# Patient Record
Sex: Female | Born: 1935 | Race: White | Hispanic: No | Smoking: Never smoker
Health system: Southern US, Community
[De-identification: ages and names within clinical notes are randomized; demographics above are authoritative.]

## PROBLEM LIST (undated history)

## (undated) DIAGNOSIS — E559 Vitamin D deficiency, unspecified: Secondary | ICD-10-CM

## (undated) DIAGNOSIS — M810 Age-related osteoporosis without current pathological fracture: Secondary | ICD-10-CM

## (undated) DIAGNOSIS — E1140 Type 2 diabetes mellitus with diabetic neuropathy, unspecified: Secondary | ICD-10-CM

## (undated) DIAGNOSIS — E785 Hyperlipidemia, unspecified: Secondary | ICD-10-CM

## (undated) DIAGNOSIS — M1990 Unspecified osteoarthritis, unspecified site: Secondary | ICD-10-CM

## (undated) DIAGNOSIS — I482 Chronic atrial fibrillation, unspecified: Secondary | ICD-10-CM

## (undated) DIAGNOSIS — E119 Type 2 diabetes mellitus without complications: Secondary | ICD-10-CM

## (undated) DIAGNOSIS — K8050 Calculus of bile duct without cholangitis or cholecystitis without obstruction: Secondary | ICD-10-CM

## (undated) DIAGNOSIS — M1711 Unilateral primary osteoarthritis, right knee: Secondary | ICD-10-CM

## (undated) DIAGNOSIS — D638 Anemia in other chronic diseases classified elsewhere: Secondary | ICD-10-CM

## (undated) DIAGNOSIS — I2699 Other pulmonary embolism without acute cor pulmonale: Secondary | ICD-10-CM

## (undated) DIAGNOSIS — I509 Heart failure, unspecified: Secondary | ICD-10-CM

## (undated) DIAGNOSIS — N183 Chronic kidney disease, stage 3 (moderate): Secondary | ICD-10-CM

## (undated) DIAGNOSIS — N189 Chronic kidney disease, unspecified: Secondary | ICD-10-CM

## (undated) DIAGNOSIS — R296 Repeated falls: Secondary | ICD-10-CM

## (undated) DIAGNOSIS — H269 Unspecified cataract: Secondary | ICD-10-CM

## (undated) DIAGNOSIS — M109 Gout, unspecified: Secondary | ICD-10-CM

## (undated) DIAGNOSIS — I5032 Chronic diastolic (congestive) heart failure: Secondary | ICD-10-CM

## (undated) DIAGNOSIS — J449 Chronic obstructive pulmonary disease, unspecified: Secondary | ICD-10-CM

## (undated) DIAGNOSIS — I110 Hypertensive heart disease with heart failure: Secondary | ICD-10-CM

## (undated) DIAGNOSIS — D689 Coagulation defect, unspecified: Secondary | ICD-10-CM

## (undated) DIAGNOSIS — E079 Disorder of thyroid, unspecified: Secondary | ICD-10-CM

## (undated) DIAGNOSIS — Z7901 Long term (current) use of anticoagulants: Secondary | ICD-10-CM

## (undated) DIAGNOSIS — I10 Essential (primary) hypertension: Secondary | ICD-10-CM

## (undated) HISTORY — DX: Repeated falls: R29.6

## (undated) HISTORY — PX: EYE SURGERY: SHX253

## (undated) HISTORY — PX: TOTAL KNEE ARTHROPLASTY: SHX125

## (undated) HISTORY — DX: Anemia in other chronic diseases classified elsewhere: D63.8

## (undated) HISTORY — DX: Type 2 diabetes mellitus with diabetic neuropathy, unspecified: E11.40

## (undated) HISTORY — PX: APPENDECTOMY: SHX54

## (undated) HISTORY — DX: Hypertensive heart disease with heart failure: I11.0

## (undated) HISTORY — DX: Disorder of thyroid, unspecified: E07.9

## (undated) HISTORY — DX: Chronic obstructive pulmonary disease, unspecified: J44.9

## (undated) HISTORY — DX: Type 2 diabetes mellitus without complications: E11.9

## (undated) HISTORY — PX: ABDOMINAL HYSTERECTOMY: SHX81

## (undated) HISTORY — DX: Unspecified cataract: H26.9

## (undated) HISTORY — DX: Coagulation defect, unspecified: D68.9

## (undated) HISTORY — DX: Other pulmonary embolism without acute cor pulmonale: I26.99

## (undated) HISTORY — DX: Calculus of bile duct without cholangitis or cholecystitis without obstruction: K80.50

## (undated) HISTORY — DX: Unilateral primary osteoarthritis, right knee: M17.11

## (undated) HISTORY — DX: Heart failure, unspecified: I50.9

## (undated) HISTORY — DX: Chronic diastolic (congestive) heart failure: I50.32

## (undated) HISTORY — DX: Long term (current) use of anticoagulants: Z79.01

## (undated) HISTORY — DX: Unspecified osteoarthritis, unspecified site: M19.90

## (undated) HISTORY — DX: Essential (primary) hypertension: I10

## (undated) HISTORY — DX: Chronic kidney disease, stage 3 (moderate): N18.3

## (undated) HISTORY — PX: ERCP: SHX60

## (undated) HISTORY — DX: Hyperlipidemia, unspecified: E78.5

## (undated) HISTORY — DX: Chronic kidney disease, unspecified: N18.9

## (undated) HISTORY — DX: Chronic atrial fibrillation, unspecified: I48.20

## (undated) HISTORY — PX: CHOLECYSTECTOMY: SHX55

## (undated) HISTORY — DX: Gout, unspecified: M10.9

## (undated) HISTORY — DX: Vitamin D deficiency, unspecified: E55.9

## (undated) HISTORY — DX: Age-related osteoporosis without current pathological fracture: M81.0

---

## 2004-05-06 ENCOUNTER — Encounter: Admission: RE | Admit: 2004-05-06 | Discharge: 2004-05-06 | Payer: Self-pay | Admitting: Rheumatology

## 2014-01-14 ENCOUNTER — Ambulatory Visit (INDEPENDENT_AMBULATORY_CARE_PROVIDER_SITE_OTHER): Payer: Medicare Other

## 2014-01-14 VITALS — BP 119/80 | HR 79 | Resp 18

## 2014-01-14 DIAGNOSIS — Q828 Other specified congenital malformations of skin: Secondary | ICD-10-CM

## 2014-01-14 DIAGNOSIS — M2040 Other hammer toe(s) (acquired), unspecified foot: Secondary | ICD-10-CM

## 2014-01-14 DIAGNOSIS — E1149 Type 2 diabetes mellitus with other diabetic neurological complication: Secondary | ICD-10-CM

## 2014-01-14 DIAGNOSIS — M1990 Unspecified osteoarthritis, unspecified site: Secondary | ICD-10-CM

## 2014-01-14 DIAGNOSIS — E1140 Type 2 diabetes mellitus with diabetic neuropathy, unspecified: Secondary | ICD-10-CM

## 2014-01-14 NOTE — Patient Instructions (Signed)
Diabetes and Foot Care Diabetes may cause you to have problems because of poor blood supply (circulation) to your feet and legs. This may cause the skin on your feet to become thinner, break easier, and heal more slowly. Your skin may become dry, and the skin may peel and crack. You may also have nerve damage in your legs and feet causing decreased feeling in them. You may not notice minor injuries to your feet that could lead to infections or more serious problems. Taking care of your feet is one of the most important things you can do for yourself.  HOME CARE INSTRUCTIONS  Wear shoes at all times, even in the house. Do not go barefoot. Bare feet are easily injured.  Check your feet daily for blisters, cuts, and redness. If you cannot see the bottom of your feet, use a mirror or ask someone for help.  Wash your feet with warm water (do not use hot water) and mild soap. Then pat your feet and the areas between your toes until they are completely dry. Do not soak your feet as this can dry your skin.  Apply a moisturizing lotion or petroleum jelly (that does not contain alcohol and is unscented) to the skin on your feet and to dry, brittle toenails. Do not apply lotion between your toes.  Trim your toenails straight across. Do not dig under them or around the cuticle. File the edges of your nails with an emery board or nail file.  Do not cut corns or calluses or try to remove them with medicine.  Wear clean socks or stockings every day. Make sure they are not too tight. Do not wear knee-high stockings since they may decrease blood flow to your legs.  Wear shoes that fit properly and have enough cushioning. To break in new shoes, wear them for just a few hours a day. This prevents you from injuring your feet. Always look in your shoes before you put them on to be sure there are no objects inside.  Do not cross your legs. This may decrease the blood flow to your feet.  If you find a minor scrape,  cut, or break in the skin on your feet, keep it and the skin around it clean and dry. These areas may be cleansed with mild soap and water. Do not cleanse the area with peroxide, alcohol, or iodine.  When you remove an adhesive bandage, be sure not to damage the skin around it.  If you have a wound, look at it several times a day to make sure it is healing.  Do not use heating pads or hot water bottles. They may burn your skin. If you have lost feeling in your feet or legs, you may not know it is happening until it is too late.  Make sure your health care provider performs a complete foot exam at least annually or more often if you have foot problems. Report any cuts, sores, or bruises to your health care provider immediately. SEEK MEDICAL CARE IF:   You have an injury that is not healing.  You have cuts or breaks in the skin.  You have an ingrown nail.  You notice redness on your legs or feet.  You feel burning or tingling in your legs or feet.  You have pain or cramps in your legs and feet.  Your legs or feet are numb.  Your feet always feel cold. SEEK IMMEDIATE MEDICAL CARE IF:   There is increasing redness,   swelling, or pain in or around a wound.  There is a red line that goes up your leg.  Pus is coming from a wound.  You develop a fever or as directed by your health care provider.  You notice a bad smell coming from an ulcer or wound. Document Released: 09/01/2000 Document Revised: 05/07/2013 Document Reviewed: 02/11/2013 [facility] Patient Information 2014 [place].  Recommend wide square rounded toe box shoe preferably lace up Velcro strap shoe recommend followup for diabetic shoe measurement and fitting.

## 2014-01-14 NOTE — Progress Notes (Signed)
   Subjective:    Patient ID: Felicia Guzman, female    DOB: 12-21-1919, 80 y.o.   MRN: [id number]  HPI my 5th toe on my right foot has been going on for about 4 months and throbs and hurts with shoes and if the sheets touch it and there has been no draining from it    Review of Systems  Endocrine: Positive for cold intolerance.  Hematological: Bruises/bleeds easily.  All other systems reviewed and are negative.      Objective:   Physical Exam 80 year old white female consistent with her daughter well-developed well-nourished. The oriented well x3 vital signs stable. Patient's complaint of a painful fifth toe right foot has been painful tender symptomatically for several months aggravated with certain shoes and activities. 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 There is associated keratoses no secondary infection no discharge drainage no ulceration there is distal to clavus third toe right foot as well which is debrided this time as well as a keratosis of the fifth toe right patient wearing shoes they're slip on a very tapered or tight in the toe box they're Lisa half-inch too narrow on her foot arch and objective findings vascular status is intact pedal pulses palpable with thready DP and PT plus one over 4 bilateral capillary refill time 3 seconds all digits mild to moderate varicosities noted patient also has multiple areas of ecchymosis secondary to Coumadin or warfarin use right foot leg and shin worse than left. No open wounds or sores or identified at this time neurologically epicritic and proprioceptive sensations appear to be diminished on Semmes Weinstein testing to forefoot digits and plantar arch normal plantar response and DTRs noted. Dermatologically skin color pigment normal hair growth absent nails criptotic incurvated her daughter contributing her nails again the  skin does show areas of ecchymosis and pigmentary changes with Coumadin use.       Assessment & Plan:  Assessment this time his diabetes with peripheral neuropathy associated painful hammertoe with corn/porokeratosis fifth toe right foot lesion is debrided to foam padding is applied and multiple pads are dispensed for patient use may recommendations for wider were coming shoes will get authorization for diabetic accident shoes and appropriate followup with shoes and insoles in the future. Patient may be candidate for palliative care in the future and as-needed basis  Harriet Masson DPM

## 2014-02-11 ENCOUNTER — Ambulatory Visit (INDEPENDENT_AMBULATORY_CARE_PROVIDER_SITE_OTHER): Payer: Medicare Other | Admitting: *Deleted

## 2014-02-11 DIAGNOSIS — E1142 Type 2 diabetes mellitus with diabetic polyneuropathy: Secondary | ICD-10-CM

## 2014-02-11 DIAGNOSIS — M2040 Other hammer toe(s) (acquired), unspecified foot: Secondary | ICD-10-CM

## 2014-02-11 DIAGNOSIS — E1149 Type 2 diabetes mellitus with other diabetic neurological complication: Secondary | ICD-10-CM

## 2014-02-11 NOTE — Patient Instructions (Signed)
I am here to get measured for shoes

## 2014-02-11 NOTE — Progress Notes (Signed)
° °  Subjective:    Patient ID: Felicia Guzman, female    DOB: 12/10/1919, 80 y.o.   MRN: [id number]  HPI I am here to get measured for diabetic shoes    Review of Systems     Objective:   Physical Exam        Assessment & Plan:

## 2014-10-09 ENCOUNTER — Ambulatory Visit: Payer: Medicare Other

## 2014-10-23 ENCOUNTER — Ambulatory Visit: Payer: Medicare Other

## 2014-11-02 ENCOUNTER — Ambulatory Visit (INDEPENDENT_AMBULATORY_CARE_PROVIDER_SITE_OTHER): Payer: Medicare Other

## 2014-11-02 VITALS — BP 131/82 | HR 88 | Resp 18

## 2014-11-02 DIAGNOSIS — Q828 Other specified congenital malformations of skin: Secondary | ICD-10-CM

## 2014-11-02 DIAGNOSIS — M2040 Other hammer toe(s) (acquired), unspecified foot: Secondary | ICD-10-CM

## 2014-11-02 DIAGNOSIS — M153 Secondary multiple arthritis: Secondary | ICD-10-CM

## 2014-11-02 DIAGNOSIS — E1140 Type 2 diabetes mellitus with diabetic neuropathy, unspecified: Secondary | ICD-10-CM | POA: Diagnosis not present

## 2014-11-02 NOTE — Progress Notes (Signed)
   Subjective:    Patient ID: Felicia Guzman, female    DOB: [birth year], 81 y.o.   MRN: [id number]  HPI I AM HERE TO GET MY DIABETIC SHOES    Review of Systems no new findings or systemic changes noted     Objective:   Physical Exam Vascular status is unchanged pedal pulses palpable DP plus one PT plus one over 4 bilateral Refill time 3 seconds all digits epicritic and proprioceptive sensations diminished on Semmes Weinstein. Patient does have notable HAV deformity bilateral left right more so than left with multiple keratoses no open wounds no ulcers no saying infections has hammertoe deformities given with keratoses. Pedal pulses are palpable thready one over 4. Patient is also on Coumadin does have a history of applications from her circulation and as well as her diabetes and neuropathy.       Assessment & Plan:  Assessment this time diabetes history peripheral neuropathy and angiopathy digital contractures hammertoe deformities and osteoarthropathy with associated keratoses this time shoes are dispensed with break in wearing instructions a fit and contour well the insoles 1 pair shoes and 3 pairs of multiple density insoles the insoles have full contact to the foot and arch patient is given instructions for use in wearing break-in follow-up in the future as needed for palliative care when needed  Harriet Masson DPM

## 2014-11-02 NOTE — Patient Instructions (Signed)

## 2015-05-03 DIAGNOSIS — Z7901 Long term (current) use of anticoagulants: Secondary | ICD-10-CM

## 2015-05-03 DIAGNOSIS — I5032 Chronic diastolic (congestive) heart failure: Secondary | ICD-10-CM

## 2015-05-03 DIAGNOSIS — I482 Chronic atrial fibrillation, unspecified: Secondary | ICD-10-CM | POA: Insufficient documentation

## 2015-05-03 DIAGNOSIS — I110 Hypertensive heart disease with heart failure: Secondary | ICD-10-CM

## 2015-05-03 HISTORY — DX: Hypertensive heart disease with heart failure: I11.0

## 2015-05-03 HISTORY — DX: Chronic atrial fibrillation, unspecified: I48.20

## 2015-05-03 HISTORY — DX: Chronic diastolic (congestive) heart failure: I50.32

## 2015-05-03 HISTORY — DX: Long term (current) use of anticoagulants: Z79.01

## 2015-09-21 DIAGNOSIS — H348121 Central retinal vein occlusion, left eye, with retinal neovascularization: Secondary | ICD-10-CM | POA: Diagnosis not present

## 2015-10-04 DIAGNOSIS — N2581 Secondary hyperparathyroidism of renal origin: Secondary | ICD-10-CM | POA: Diagnosis not present

## 2015-10-04 DIAGNOSIS — N184 Chronic kidney disease, stage 4 (severe): Secondary | ICD-10-CM | POA: Diagnosis not present

## 2015-10-13 DIAGNOSIS — M13861 Other specified arthritis, right knee: Secondary | ICD-10-CM | POA: Diagnosis not present

## 2015-10-13 DIAGNOSIS — M25461 Effusion, right knee: Secondary | ICD-10-CM | POA: Diagnosis not present

## 2015-10-13 DIAGNOSIS — M1990 Unspecified osteoarthritis, unspecified site: Secondary | ICD-10-CM | POA: Diagnosis not present

## 2015-10-13 DIAGNOSIS — M7989 Other specified soft tissue disorders: Secondary | ICD-10-CM | POA: Diagnosis not present

## 2015-10-13 DIAGNOSIS — M25561 Pain in right knee: Secondary | ICD-10-CM | POA: Diagnosis not present

## 2015-10-19 DIAGNOSIS — N2581 Secondary hyperparathyroidism of renal origin: Secondary | ICD-10-CM | POA: Diagnosis not present

## 2015-10-19 DIAGNOSIS — I10 Essential (primary) hypertension: Secondary | ICD-10-CM | POA: Diagnosis not present

## 2015-10-19 DIAGNOSIS — N184 Chronic kidney disease, stage 4 (severe): Secondary | ICD-10-CM | POA: Diagnosis not present

## 2015-10-19 DIAGNOSIS — D631 Anemia in chronic kidney disease: Secondary | ICD-10-CM | POA: Diagnosis not present

## 2015-10-20 DIAGNOSIS — K8050 Calculus of bile duct without cholangitis or cholecystitis without obstruction: Secondary | ICD-10-CM | POA: Diagnosis not present

## 2015-10-21 DIAGNOSIS — L03116 Cellulitis of left lower limb: Secondary | ICD-10-CM | POA: Diagnosis not present

## 2015-10-22 DIAGNOSIS — S81811A Laceration without foreign body, right lower leg, initial encounter: Secondary | ICD-10-CM | POA: Diagnosis not present

## 2015-10-22 DIAGNOSIS — I10 Essential (primary) hypertension: Secondary | ICD-10-CM | POA: Diagnosis not present

## 2015-10-22 DIAGNOSIS — M109 Gout, unspecified: Secondary | ICD-10-CM | POA: Diagnosis not present

## 2015-10-22 DIAGNOSIS — S81812A Laceration without foreign body, left lower leg, initial encounter: Secondary | ICD-10-CM | POA: Diagnosis not present

## 2015-10-22 DIAGNOSIS — S81801A Unspecified open wound, right lower leg, initial encounter: Secondary | ICD-10-CM | POA: Diagnosis not present

## 2015-10-22 DIAGNOSIS — I509 Heart failure, unspecified: Secondary | ICD-10-CM | POA: Diagnosis not present

## 2015-10-22 DIAGNOSIS — S81802A Unspecified open wound, left lower leg, initial encounter: Secondary | ICD-10-CM | POA: Diagnosis not present

## 2015-10-25 DIAGNOSIS — M1711 Unilateral primary osteoarthritis, right knee: Secondary | ICD-10-CM

## 2015-10-25 HISTORY — DX: Unilateral primary osteoarthritis, right knee: M17.11

## 2015-10-28 DIAGNOSIS — S81801A Unspecified open wound, right lower leg, initial encounter: Secondary | ICD-10-CM | POA: Diagnosis not present

## 2015-10-28 DIAGNOSIS — S81801D Unspecified open wound, right lower leg, subsequent encounter: Secondary | ICD-10-CM | POA: Diagnosis not present

## 2015-10-28 DIAGNOSIS — S81802A Unspecified open wound, left lower leg, initial encounter: Secondary | ICD-10-CM | POA: Diagnosis not present

## 2015-10-28 DIAGNOSIS — S81802D Unspecified open wound, left lower leg, subsequent encounter: Secondary | ICD-10-CM | POA: Diagnosis not present

## 2015-11-08 DIAGNOSIS — S81802A Unspecified open wound, left lower leg, initial encounter: Secondary | ICD-10-CM | POA: Diagnosis not present

## 2015-11-08 DIAGNOSIS — S81801S Unspecified open wound, right lower leg, sequela: Secondary | ICD-10-CM | POA: Diagnosis not present

## 2015-11-08 DIAGNOSIS — R609 Edema, unspecified: Secondary | ICD-10-CM | POA: Diagnosis not present

## 2015-12-09 DIAGNOSIS — H34812 Central retinal vein occlusion, left eye, with macular edema: Secondary | ICD-10-CM | POA: Diagnosis not present

## 2016-01-04 DIAGNOSIS — H34812 Central retinal vein occlusion, left eye, with macular edema: Secondary | ICD-10-CM | POA: Diagnosis not present

## 2016-01-11 DIAGNOSIS — E1122 Type 2 diabetes mellitus with diabetic chronic kidney disease: Secondary | ICD-10-CM | POA: Diagnosis not present

## 2016-01-11 DIAGNOSIS — Z87898 Personal history of other specified conditions: Secondary | ICD-10-CM | POA: Diagnosis not present

## 2016-01-11 DIAGNOSIS — I130 Hypertensive heart and chronic kidney disease with heart failure and stage 1 through stage 4 chronic kidney disease, or unspecified chronic kidney disease: Secondary | ICD-10-CM | POA: Diagnosis not present

## 2016-01-11 DIAGNOSIS — N183 Chronic kidney disease, stage 3 (moderate): Secondary | ICD-10-CM | POA: Diagnosis not present

## 2016-01-11 DIAGNOSIS — D638 Anemia in other chronic diseases classified elsewhere: Secondary | ICD-10-CM | POA: Diagnosis not present

## 2016-01-11 DIAGNOSIS — E039 Hypothyroidism, unspecified: Secondary | ICD-10-CM | POA: Diagnosis not present

## 2016-01-11 DIAGNOSIS — E785 Hyperlipidemia, unspecified: Secondary | ICD-10-CM | POA: Diagnosis not present

## 2016-09-24 DIAGNOSIS — R0600 Dyspnea, unspecified: Secondary | ICD-10-CM | POA: Diagnosis not present

## 2016-09-24 DIAGNOSIS — J22 Unspecified acute lower respiratory infection: Secondary | ICD-10-CM | POA: Diagnosis not present

## 2016-10-03 DIAGNOSIS — N184 Chronic kidney disease, stage 4 (severe): Secondary | ICD-10-CM | POA: Diagnosis not present

## 2016-10-11 DIAGNOSIS — N2581 Secondary hyperparathyroidism of renal origin: Secondary | ICD-10-CM | POA: Diagnosis not present

## 2016-10-11 DIAGNOSIS — I10 Essential (primary) hypertension: Secondary | ICD-10-CM | POA: Diagnosis not present

## 2016-10-11 DIAGNOSIS — N183 Chronic kidney disease, stage 3 (moderate): Secondary | ICD-10-CM | POA: Diagnosis not present

## 2016-10-30 DIAGNOSIS — E039 Hypothyroidism, unspecified: Secondary | ICD-10-CM | POA: Diagnosis not present

## 2016-10-30 DIAGNOSIS — I130 Hypertensive heart and chronic kidney disease with heart failure and stage 1 through stage 4 chronic kidney disease, or unspecified chronic kidney disease: Secondary | ICD-10-CM | POA: Diagnosis not present

## 2016-10-30 DIAGNOSIS — E785 Hyperlipidemia, unspecified: Secondary | ICD-10-CM | POA: Diagnosis not present

## 2016-10-30 DIAGNOSIS — N184 Chronic kidney disease, stage 4 (severe): Secondary | ICD-10-CM | POA: Diagnosis not present

## 2016-10-30 DIAGNOSIS — E1122 Type 2 diabetes mellitus with diabetic chronic kidney disease: Secondary | ICD-10-CM | POA: Diagnosis not present

## 2016-12-13 DIAGNOSIS — E1143 Type 2 diabetes mellitus with diabetic autonomic (poly)neuropathy: Secondary | ICD-10-CM | POA: Diagnosis not present

## 2016-12-13 DIAGNOSIS — S81802A Unspecified open wound, left lower leg, initial encounter: Secondary | ICD-10-CM | POA: Diagnosis not present

## 2016-12-13 DIAGNOSIS — L03116 Cellulitis of left lower limb: Secondary | ICD-10-CM | POA: Diagnosis not present

## 2016-12-13 DIAGNOSIS — S81801A Unspecified open wound, right lower leg, initial encounter: Secondary | ICD-10-CM | POA: Diagnosis not present

## 2016-12-13 DIAGNOSIS — L03115 Cellulitis of right lower limb: Secondary | ICD-10-CM | POA: Diagnosis not present

## 2016-12-14 DIAGNOSIS — L989 Disorder of the skin and subcutaneous tissue, unspecified: Secondary | ICD-10-CM | POA: Diagnosis not present

## 2016-12-14 DIAGNOSIS — N189 Chronic kidney disease, unspecified: Secondary | ICD-10-CM | POA: Diagnosis not present

## 2016-12-14 DIAGNOSIS — I5032 Chronic diastolic (congestive) heart failure: Secondary | ICD-10-CM | POA: Diagnosis not present

## 2016-12-14 DIAGNOSIS — I130 Hypertensive heart and chronic kidney disease with heart failure and stage 1 through stage 4 chronic kidney disease, or unspecified chronic kidney disease: Secondary | ICD-10-CM | POA: Diagnosis not present

## 2016-12-14 DIAGNOSIS — E1143 Type 2 diabetes mellitus with diabetic autonomic (poly)neuropathy: Secondary | ICD-10-CM | POA: Diagnosis not present

## 2016-12-14 DIAGNOSIS — E1122 Type 2 diabetes mellitus with diabetic chronic kidney disease: Secondary | ICD-10-CM | POA: Diagnosis not present

## 2016-12-16 DIAGNOSIS — E1143 Type 2 diabetes mellitus with diabetic autonomic (poly)neuropathy: Secondary | ICD-10-CM | POA: Diagnosis not present

## 2016-12-16 DIAGNOSIS — N189 Chronic kidney disease, unspecified: Secondary | ICD-10-CM | POA: Diagnosis not present

## 2016-12-16 DIAGNOSIS — I130 Hypertensive heart and chronic kidney disease with heart failure and stage 1 through stage 4 chronic kidney disease, or unspecified chronic kidney disease: Secondary | ICD-10-CM | POA: Diagnosis not present

## 2016-12-16 DIAGNOSIS — I5032 Chronic diastolic (congestive) heart failure: Secondary | ICD-10-CM | POA: Diagnosis not present

## 2016-12-16 DIAGNOSIS — L989 Disorder of the skin and subcutaneous tissue, unspecified: Secondary | ICD-10-CM | POA: Diagnosis not present

## 2016-12-16 DIAGNOSIS — E1122 Type 2 diabetes mellitus with diabetic chronic kidney disease: Secondary | ICD-10-CM | POA: Diagnosis not present

## 2016-12-18 DIAGNOSIS — N189 Chronic kidney disease, unspecified: Secondary | ICD-10-CM | POA: Diagnosis not present

## 2016-12-18 DIAGNOSIS — I130 Hypertensive heart and chronic kidney disease with heart failure and stage 1 through stage 4 chronic kidney disease, or unspecified chronic kidney disease: Secondary | ICD-10-CM | POA: Diagnosis not present

## 2016-12-18 DIAGNOSIS — E1143 Type 2 diabetes mellitus with diabetic autonomic (poly)neuropathy: Secondary | ICD-10-CM | POA: Diagnosis not present

## 2016-12-18 DIAGNOSIS — I5032 Chronic diastolic (congestive) heart failure: Secondary | ICD-10-CM | POA: Diagnosis not present

## 2016-12-18 DIAGNOSIS — E1122 Type 2 diabetes mellitus with diabetic chronic kidney disease: Secondary | ICD-10-CM | POA: Diagnosis not present

## 2016-12-18 DIAGNOSIS — L989 Disorder of the skin and subcutaneous tissue, unspecified: Secondary | ICD-10-CM | POA: Diagnosis not present

## 2016-12-20 DIAGNOSIS — N189 Chronic kidney disease, unspecified: Secondary | ICD-10-CM | POA: Diagnosis not present

## 2016-12-20 DIAGNOSIS — E1143 Type 2 diabetes mellitus with diabetic autonomic (poly)neuropathy: Secondary | ICD-10-CM | POA: Diagnosis not present

## 2016-12-20 DIAGNOSIS — L989 Disorder of the skin and subcutaneous tissue, unspecified: Secondary | ICD-10-CM | POA: Diagnosis not present

## 2016-12-20 DIAGNOSIS — I130 Hypertensive heart and chronic kidney disease with heart failure and stage 1 through stage 4 chronic kidney disease, or unspecified chronic kidney disease: Secondary | ICD-10-CM | POA: Diagnosis not present

## 2016-12-20 DIAGNOSIS — E1122 Type 2 diabetes mellitus with diabetic chronic kidney disease: Secondary | ICD-10-CM | POA: Diagnosis not present

## 2016-12-20 DIAGNOSIS — H34812 Central retinal vein occlusion, left eye, with macular edema: Secondary | ICD-10-CM | POA: Diagnosis not present

## 2016-12-20 DIAGNOSIS — I5032 Chronic diastolic (congestive) heart failure: Secondary | ICD-10-CM | POA: Diagnosis not present

## 2016-12-22 DIAGNOSIS — I130 Hypertensive heart and chronic kidney disease with heart failure and stage 1 through stage 4 chronic kidney disease, or unspecified chronic kidney disease: Secondary | ICD-10-CM | POA: Diagnosis not present

## 2016-12-22 DIAGNOSIS — E1122 Type 2 diabetes mellitus with diabetic chronic kidney disease: Secondary | ICD-10-CM | POA: Diagnosis not present

## 2016-12-22 DIAGNOSIS — N189 Chronic kidney disease, unspecified: Secondary | ICD-10-CM | POA: Diagnosis not present

## 2016-12-22 DIAGNOSIS — I5032 Chronic diastolic (congestive) heart failure: Secondary | ICD-10-CM | POA: Diagnosis not present

## 2016-12-22 DIAGNOSIS — L989 Disorder of the skin and subcutaneous tissue, unspecified: Secondary | ICD-10-CM | POA: Diagnosis not present

## 2016-12-22 DIAGNOSIS — E1143 Type 2 diabetes mellitus with diabetic autonomic (poly)neuropathy: Secondary | ICD-10-CM | POA: Diagnosis not present

## 2016-12-26 DIAGNOSIS — E1122 Type 2 diabetes mellitus with diabetic chronic kidney disease: Secondary | ICD-10-CM | POA: Diagnosis not present

## 2016-12-26 DIAGNOSIS — L989 Disorder of the skin and subcutaneous tissue, unspecified: Secondary | ICD-10-CM | POA: Diagnosis not present

## 2016-12-26 DIAGNOSIS — I130 Hypertensive heart and chronic kidney disease with heart failure and stage 1 through stage 4 chronic kidney disease, or unspecified chronic kidney disease: Secondary | ICD-10-CM | POA: Diagnosis not present

## 2016-12-26 DIAGNOSIS — E1143 Type 2 diabetes mellitus with diabetic autonomic (poly)neuropathy: Secondary | ICD-10-CM | POA: Diagnosis not present

## 2016-12-26 DIAGNOSIS — I5032 Chronic diastolic (congestive) heart failure: Secondary | ICD-10-CM | POA: Diagnosis not present

## 2016-12-26 DIAGNOSIS — N189 Chronic kidney disease, unspecified: Secondary | ICD-10-CM | POA: Diagnosis not present

## 2016-12-27 DIAGNOSIS — L03116 Cellulitis of left lower limb: Secondary | ICD-10-CM | POA: Diagnosis not present

## 2016-12-27 DIAGNOSIS — L03115 Cellulitis of right lower limb: Secondary | ICD-10-CM | POA: Diagnosis not present

## 2016-12-29 DIAGNOSIS — E1122 Type 2 diabetes mellitus with diabetic chronic kidney disease: Secondary | ICD-10-CM | POA: Diagnosis not present

## 2016-12-29 DIAGNOSIS — I5032 Chronic diastolic (congestive) heart failure: Secondary | ICD-10-CM | POA: Diagnosis not present

## 2016-12-29 DIAGNOSIS — L989 Disorder of the skin and subcutaneous tissue, unspecified: Secondary | ICD-10-CM | POA: Diagnosis not present

## 2016-12-29 DIAGNOSIS — I130 Hypertensive heart and chronic kidney disease with heart failure and stage 1 through stage 4 chronic kidney disease, or unspecified chronic kidney disease: Secondary | ICD-10-CM | POA: Diagnosis not present

## 2016-12-29 DIAGNOSIS — N189 Chronic kidney disease, unspecified: Secondary | ICD-10-CM | POA: Diagnosis not present

## 2016-12-29 DIAGNOSIS — E1143 Type 2 diabetes mellitus with diabetic autonomic (poly)neuropathy: Secondary | ICD-10-CM | POA: Diagnosis not present

## 2017-01-01 DIAGNOSIS — E1143 Type 2 diabetes mellitus with diabetic autonomic (poly)neuropathy: Secondary | ICD-10-CM | POA: Diagnosis not present

## 2017-01-01 DIAGNOSIS — I5032 Chronic diastolic (congestive) heart failure: Secondary | ICD-10-CM | POA: Diagnosis not present

## 2017-01-01 DIAGNOSIS — I130 Hypertensive heart and chronic kidney disease with heart failure and stage 1 through stage 4 chronic kidney disease, or unspecified chronic kidney disease: Secondary | ICD-10-CM | POA: Diagnosis not present

## 2017-01-01 DIAGNOSIS — L989 Disorder of the skin and subcutaneous tissue, unspecified: Secondary | ICD-10-CM | POA: Diagnosis not present

## 2017-01-01 DIAGNOSIS — E1122 Type 2 diabetes mellitus with diabetic chronic kidney disease: Secondary | ICD-10-CM | POA: Diagnosis not present

## 2017-01-01 DIAGNOSIS — N189 Chronic kidney disease, unspecified: Secondary | ICD-10-CM | POA: Diagnosis not present

## 2017-01-03 DIAGNOSIS — L03116 Cellulitis of left lower limb: Secondary | ICD-10-CM | POA: Diagnosis not present

## 2017-01-03 DIAGNOSIS — L03115 Cellulitis of right lower limb: Secondary | ICD-10-CM | POA: Diagnosis not present

## 2017-01-04 DIAGNOSIS — E1143 Type 2 diabetes mellitus with diabetic autonomic (poly)neuropathy: Secondary | ICD-10-CM | POA: Diagnosis not present

## 2017-01-04 DIAGNOSIS — N189 Chronic kidney disease, unspecified: Secondary | ICD-10-CM | POA: Diagnosis not present

## 2017-01-04 DIAGNOSIS — L989 Disorder of the skin and subcutaneous tissue, unspecified: Secondary | ICD-10-CM | POA: Diagnosis not present

## 2017-01-04 DIAGNOSIS — E1122 Type 2 diabetes mellitus with diabetic chronic kidney disease: Secondary | ICD-10-CM | POA: Diagnosis not present

## 2017-01-04 DIAGNOSIS — I130 Hypertensive heart and chronic kidney disease with heart failure and stage 1 through stage 4 chronic kidney disease, or unspecified chronic kidney disease: Secondary | ICD-10-CM | POA: Diagnosis not present

## 2017-01-04 DIAGNOSIS — I5032 Chronic diastolic (congestive) heart failure: Secondary | ICD-10-CM | POA: Diagnosis not present

## 2017-01-08 DIAGNOSIS — E1122 Type 2 diabetes mellitus with diabetic chronic kidney disease: Secondary | ICD-10-CM | POA: Diagnosis not present

## 2017-01-08 DIAGNOSIS — I130 Hypertensive heart and chronic kidney disease with heart failure and stage 1 through stage 4 chronic kidney disease, or unspecified chronic kidney disease: Secondary | ICD-10-CM | POA: Diagnosis not present

## 2017-01-08 DIAGNOSIS — N189 Chronic kidney disease, unspecified: Secondary | ICD-10-CM | POA: Diagnosis not present

## 2017-01-08 DIAGNOSIS — I5032 Chronic diastolic (congestive) heart failure: Secondary | ICD-10-CM | POA: Diagnosis not present

## 2017-01-08 DIAGNOSIS — E1143 Type 2 diabetes mellitus with diabetic autonomic (poly)neuropathy: Secondary | ICD-10-CM | POA: Diagnosis not present

## 2017-01-08 DIAGNOSIS — L989 Disorder of the skin and subcutaneous tissue, unspecified: Secondary | ICD-10-CM | POA: Diagnosis not present

## 2017-01-16 DIAGNOSIS — I5032 Chronic diastolic (congestive) heart failure: Secondary | ICD-10-CM | POA: Diagnosis not present

## 2017-01-16 DIAGNOSIS — E1122 Type 2 diabetes mellitus with diabetic chronic kidney disease: Secondary | ICD-10-CM | POA: Diagnosis not present

## 2017-01-16 DIAGNOSIS — I130 Hypertensive heart and chronic kidney disease with heart failure and stage 1 through stage 4 chronic kidney disease, or unspecified chronic kidney disease: Secondary | ICD-10-CM | POA: Diagnosis not present

## 2017-01-16 DIAGNOSIS — L989 Disorder of the skin and subcutaneous tissue, unspecified: Secondary | ICD-10-CM | POA: Diagnosis not present

## 2017-01-16 DIAGNOSIS — N189 Chronic kidney disease, unspecified: Secondary | ICD-10-CM | POA: Diagnosis not present

## 2017-01-16 DIAGNOSIS — E1143 Type 2 diabetes mellitus with diabetic autonomic (poly)neuropathy: Secondary | ICD-10-CM | POA: Diagnosis not present

## 2017-01-17 DIAGNOSIS — L03115 Cellulitis of right lower limb: Secondary | ICD-10-CM | POA: Diagnosis not present

## 2017-01-25 DIAGNOSIS — N189 Chronic kidney disease, unspecified: Secondary | ICD-10-CM | POA: Diagnosis not present

## 2017-01-25 DIAGNOSIS — L989 Disorder of the skin and subcutaneous tissue, unspecified: Secondary | ICD-10-CM | POA: Diagnosis not present

## 2017-01-25 DIAGNOSIS — I5032 Chronic diastolic (congestive) heart failure: Secondary | ICD-10-CM | POA: Diagnosis not present

## 2017-01-25 DIAGNOSIS — E1143 Type 2 diabetes mellitus with diabetic autonomic (poly)neuropathy: Secondary | ICD-10-CM | POA: Diagnosis not present

## 2017-01-25 DIAGNOSIS — E1122 Type 2 diabetes mellitus with diabetic chronic kidney disease: Secondary | ICD-10-CM | POA: Diagnosis not present

## 2017-01-25 DIAGNOSIS — I130 Hypertensive heart and chronic kidney disease with heart failure and stage 1 through stage 4 chronic kidney disease, or unspecified chronic kidney disease: Secondary | ICD-10-CM | POA: Diagnosis not present

## 2017-01-29 DIAGNOSIS — E1143 Type 2 diabetes mellitus with diabetic autonomic (poly)neuropathy: Secondary | ICD-10-CM | POA: Diagnosis not present

## 2017-01-29 DIAGNOSIS — L989 Disorder of the skin and subcutaneous tissue, unspecified: Secondary | ICD-10-CM | POA: Diagnosis not present

## 2017-01-29 DIAGNOSIS — E1122 Type 2 diabetes mellitus with diabetic chronic kidney disease: Secondary | ICD-10-CM | POA: Diagnosis not present

## 2017-01-29 DIAGNOSIS — I5032 Chronic diastolic (congestive) heart failure: Secondary | ICD-10-CM | POA: Diagnosis not present

## 2017-01-29 DIAGNOSIS — I130 Hypertensive heart and chronic kidney disease with heart failure and stage 1 through stage 4 chronic kidney disease, or unspecified chronic kidney disease: Secondary | ICD-10-CM | POA: Diagnosis not present

## 2017-01-29 DIAGNOSIS — N189 Chronic kidney disease, unspecified: Secondary | ICD-10-CM | POA: Diagnosis not present

## 2017-01-31 DIAGNOSIS — I482 Chronic atrial fibrillation: Secondary | ICD-10-CM | POA: Diagnosis not present

## 2017-01-31 DIAGNOSIS — I5032 Chronic diastolic (congestive) heart failure: Secondary | ICD-10-CM | POA: Diagnosis not present

## 2017-01-31 DIAGNOSIS — Z7901 Long term (current) use of anticoagulants: Secondary | ICD-10-CM | POA: Diagnosis not present

## 2017-02-05 DIAGNOSIS — I130 Hypertensive heart and chronic kidney disease with heart failure and stage 1 through stage 4 chronic kidney disease, or unspecified chronic kidney disease: Secondary | ICD-10-CM | POA: Diagnosis not present

## 2017-02-05 DIAGNOSIS — E1143 Type 2 diabetes mellitus with diabetic autonomic (poly)neuropathy: Secondary | ICD-10-CM | POA: Diagnosis not present

## 2017-02-05 DIAGNOSIS — N189 Chronic kidney disease, unspecified: Secondary | ICD-10-CM | POA: Diagnosis not present

## 2017-02-05 DIAGNOSIS — I5032 Chronic diastolic (congestive) heart failure: Secondary | ICD-10-CM | POA: Diagnosis not present

## 2017-02-05 DIAGNOSIS — L989 Disorder of the skin and subcutaneous tissue, unspecified: Secondary | ICD-10-CM | POA: Diagnosis not present

## 2017-02-05 DIAGNOSIS — E1122 Type 2 diabetes mellitus with diabetic chronic kidney disease: Secondary | ICD-10-CM | POA: Diagnosis not present

## 2017-02-09 DIAGNOSIS — I130 Hypertensive heart and chronic kidney disease with heart failure and stage 1 through stage 4 chronic kidney disease, or unspecified chronic kidney disease: Secondary | ICD-10-CM | POA: Diagnosis not present

## 2017-02-09 DIAGNOSIS — N189 Chronic kidney disease, unspecified: Secondary | ICD-10-CM | POA: Diagnosis not present

## 2017-02-09 DIAGNOSIS — E1143 Type 2 diabetes mellitus with diabetic autonomic (poly)neuropathy: Secondary | ICD-10-CM | POA: Diagnosis not present

## 2017-02-09 DIAGNOSIS — L989 Disorder of the skin and subcutaneous tissue, unspecified: Secondary | ICD-10-CM | POA: Diagnosis not present

## 2017-02-09 DIAGNOSIS — I5032 Chronic diastolic (congestive) heart failure: Secondary | ICD-10-CM | POA: Diagnosis not present

## 2017-02-09 DIAGNOSIS — E1122 Type 2 diabetes mellitus with diabetic chronic kidney disease: Secondary | ICD-10-CM | POA: Diagnosis not present

## 2017-02-15 DIAGNOSIS — I5032 Chronic diastolic (congestive) heart failure: Secondary | ICD-10-CM | POA: Diagnosis not present

## 2017-02-15 DIAGNOSIS — I130 Hypertensive heart and chronic kidney disease with heart failure and stage 1 through stage 4 chronic kidney disease, or unspecified chronic kidney disease: Secondary | ICD-10-CM | POA: Diagnosis not present

## 2017-02-15 DIAGNOSIS — S51801D Unspecified open wound of right forearm, subsequent encounter: Secondary | ICD-10-CM | POA: Diagnosis not present

## 2017-02-15 DIAGNOSIS — N189 Chronic kidney disease, unspecified: Secondary | ICD-10-CM | POA: Diagnosis not present

## 2017-02-15 DIAGNOSIS — L989 Disorder of the skin and subcutaneous tissue, unspecified: Secondary | ICD-10-CM | POA: Diagnosis not present

## 2017-02-15 DIAGNOSIS — E1122 Type 2 diabetes mellitus with diabetic chronic kidney disease: Secondary | ICD-10-CM | POA: Diagnosis not present

## 2017-02-16 DIAGNOSIS — E1122 Type 2 diabetes mellitus with diabetic chronic kidney disease: Secondary | ICD-10-CM | POA: Diagnosis not present

## 2017-02-16 DIAGNOSIS — I5032 Chronic diastolic (congestive) heart failure: Secondary | ICD-10-CM | POA: Diagnosis not present

## 2017-02-16 DIAGNOSIS — Z789 Other specified health status: Secondary | ICD-10-CM | POA: Diagnosis not present

## 2017-02-16 DIAGNOSIS — L989 Disorder of the skin and subcutaneous tissue, unspecified: Secondary | ICD-10-CM | POA: Diagnosis not present

## 2017-02-16 DIAGNOSIS — S51801D Unspecified open wound of right forearm, subsequent encounter: Secondary | ICD-10-CM | POA: Diagnosis not present

## 2017-02-16 DIAGNOSIS — N189 Chronic kidney disease, unspecified: Secondary | ICD-10-CM | POA: Diagnosis not present

## 2017-02-16 DIAGNOSIS — I130 Hypertensive heart and chronic kidney disease with heart failure and stage 1 through stage 4 chronic kidney disease, or unspecified chronic kidney disease: Secondary | ICD-10-CM | POA: Diagnosis not present

## 2017-02-19 DIAGNOSIS — S51801D Unspecified open wound of right forearm, subsequent encounter: Secondary | ICD-10-CM | POA: Diagnosis not present

## 2017-02-19 DIAGNOSIS — I130 Hypertensive heart and chronic kidney disease with heart failure and stage 1 through stage 4 chronic kidney disease, or unspecified chronic kidney disease: Secondary | ICD-10-CM | POA: Diagnosis not present

## 2017-02-19 DIAGNOSIS — L989 Disorder of the skin and subcutaneous tissue, unspecified: Secondary | ICD-10-CM | POA: Diagnosis not present

## 2017-02-19 DIAGNOSIS — N189 Chronic kidney disease, unspecified: Secondary | ICD-10-CM | POA: Diagnosis not present

## 2017-02-19 DIAGNOSIS — I5032 Chronic diastolic (congestive) heart failure: Secondary | ICD-10-CM | POA: Diagnosis not present

## 2017-02-19 DIAGNOSIS — E1122 Type 2 diabetes mellitus with diabetic chronic kidney disease: Secondary | ICD-10-CM | POA: Diagnosis not present

## 2017-02-22 DIAGNOSIS — I130 Hypertensive heart and chronic kidney disease with heart failure and stage 1 through stage 4 chronic kidney disease, or unspecified chronic kidney disease: Secondary | ICD-10-CM | POA: Diagnosis not present

## 2017-02-22 DIAGNOSIS — E1122 Type 2 diabetes mellitus with diabetic chronic kidney disease: Secondary | ICD-10-CM | POA: Diagnosis not present

## 2017-02-22 DIAGNOSIS — L989 Disorder of the skin and subcutaneous tissue, unspecified: Secondary | ICD-10-CM | POA: Diagnosis not present

## 2017-02-22 DIAGNOSIS — I5032 Chronic diastolic (congestive) heart failure: Secondary | ICD-10-CM | POA: Diagnosis not present

## 2017-02-22 DIAGNOSIS — N189 Chronic kidney disease, unspecified: Secondary | ICD-10-CM | POA: Diagnosis not present

## 2017-02-22 DIAGNOSIS — S51801D Unspecified open wound of right forearm, subsequent encounter: Secondary | ICD-10-CM | POA: Diagnosis not present

## 2017-02-27 DIAGNOSIS — L989 Disorder of the skin and subcutaneous tissue, unspecified: Secondary | ICD-10-CM | POA: Diagnosis not present

## 2017-02-27 DIAGNOSIS — I130 Hypertensive heart and chronic kidney disease with heart failure and stage 1 through stage 4 chronic kidney disease, or unspecified chronic kidney disease: Secondary | ICD-10-CM | POA: Diagnosis not present

## 2017-02-27 DIAGNOSIS — E1122 Type 2 diabetes mellitus with diabetic chronic kidney disease: Secondary | ICD-10-CM | POA: Diagnosis not present

## 2017-02-27 DIAGNOSIS — N189 Chronic kidney disease, unspecified: Secondary | ICD-10-CM | POA: Diagnosis not present

## 2017-02-27 DIAGNOSIS — I5032 Chronic diastolic (congestive) heart failure: Secondary | ICD-10-CM | POA: Diagnosis not present

## 2017-02-27 DIAGNOSIS — S51801D Unspecified open wound of right forearm, subsequent encounter: Secondary | ICD-10-CM | POA: Diagnosis not present

## 2017-03-02 DIAGNOSIS — H35033 Hypertensive retinopathy, bilateral: Secondary | ICD-10-CM | POA: Diagnosis not present

## 2017-03-02 DIAGNOSIS — H01021 Squamous blepharitis right upper eyelid: Secondary | ICD-10-CM | POA: Diagnosis not present

## 2017-03-06 DIAGNOSIS — L03115 Cellulitis of right lower limb: Secondary | ICD-10-CM | POA: Diagnosis not present

## 2017-03-08 DIAGNOSIS — N189 Chronic kidney disease, unspecified: Secondary | ICD-10-CM | POA: Diagnosis not present

## 2017-03-08 DIAGNOSIS — I5032 Chronic diastolic (congestive) heart failure: Secondary | ICD-10-CM | POA: Diagnosis not present

## 2017-03-08 DIAGNOSIS — I130 Hypertensive heart and chronic kidney disease with heart failure and stage 1 through stage 4 chronic kidney disease, or unspecified chronic kidney disease: Secondary | ICD-10-CM | POA: Diagnosis not present

## 2017-03-08 DIAGNOSIS — E1122 Type 2 diabetes mellitus with diabetic chronic kidney disease: Secondary | ICD-10-CM | POA: Diagnosis not present

## 2017-03-08 DIAGNOSIS — L989 Disorder of the skin and subcutaneous tissue, unspecified: Secondary | ICD-10-CM | POA: Diagnosis not present

## 2017-03-08 DIAGNOSIS — S51801D Unspecified open wound of right forearm, subsequent encounter: Secondary | ICD-10-CM | POA: Diagnosis not present

## 2017-03-09 DIAGNOSIS — Z79899 Other long term (current) drug therapy: Secondary | ICD-10-CM | POA: Diagnosis not present

## 2017-03-09 DIAGNOSIS — I4891 Unspecified atrial fibrillation: Secondary | ICD-10-CM | POA: Diagnosis not present

## 2017-03-09 DIAGNOSIS — I130 Hypertensive heart and chronic kidney disease with heart failure and stage 1 through stage 4 chronic kidney disease, or unspecified chronic kidney disease: Secondary | ICD-10-CM | POA: Diagnosis not present

## 2017-03-09 DIAGNOSIS — L03115 Cellulitis of right lower limb: Secondary | ICD-10-CM | POA: Diagnosis not present

## 2017-03-09 DIAGNOSIS — E1122 Type 2 diabetes mellitus with diabetic chronic kidney disease: Secondary | ICD-10-CM | POA: Diagnosis not present

## 2017-03-09 DIAGNOSIS — S81801A Unspecified open wound, right lower leg, initial encounter: Secondary | ICD-10-CM | POA: Diagnosis not present

## 2017-03-09 DIAGNOSIS — N183 Chronic kidney disease, stage 3 (moderate): Secondary | ICD-10-CM | POA: Diagnosis not present

## 2017-03-09 DIAGNOSIS — J449 Chronic obstructive pulmonary disease, unspecified: Secondary | ICD-10-CM | POA: Diagnosis not present

## 2017-03-09 DIAGNOSIS — Z66 Do not resuscitate: Secondary | ICD-10-CM | POA: Diagnosis not present

## 2017-03-09 DIAGNOSIS — Z7982 Long term (current) use of aspirin: Secondary | ICD-10-CM | POA: Diagnosis not present

## 2017-03-09 DIAGNOSIS — I509 Heart failure, unspecified: Secondary | ICD-10-CM | POA: Diagnosis not present

## 2017-03-09 DIAGNOSIS — E039 Hypothyroidism, unspecified: Secondary | ICD-10-CM | POA: Diagnosis not present

## 2017-03-09 DIAGNOSIS — N189 Chronic kidney disease, unspecified: Secondary | ICD-10-CM | POA: Diagnosis not present

## 2017-03-09 DIAGNOSIS — K219 Gastro-esophageal reflux disease without esophagitis: Secondary | ICD-10-CM | POA: Diagnosis not present

## 2017-03-10 DIAGNOSIS — I4891 Unspecified atrial fibrillation: Secondary | ICD-10-CM | POA: Diagnosis not present

## 2017-03-10 DIAGNOSIS — L03115 Cellulitis of right lower limb: Secondary | ICD-10-CM | POA: Diagnosis not present

## 2017-03-10 DIAGNOSIS — N189 Chronic kidney disease, unspecified: Secondary | ICD-10-CM | POA: Diagnosis not present

## 2017-03-10 DIAGNOSIS — J449 Chronic obstructive pulmonary disease, unspecified: Secondary | ICD-10-CM | POA: Diagnosis not present

## 2017-03-13 DIAGNOSIS — Z9189 Other specified personal risk factors, not elsewhere classified: Secondary | ICD-10-CM | POA: Diagnosis not present

## 2017-03-13 DIAGNOSIS — L03115 Cellulitis of right lower limb: Secondary | ICD-10-CM | POA: Diagnosis not present

## 2017-03-13 DIAGNOSIS — I130 Hypertensive heart and chronic kidney disease with heart failure and stage 1 through stage 4 chronic kidney disease, or unspecified chronic kidney disease: Secondary | ICD-10-CM | POA: Diagnosis not present

## 2017-03-13 DIAGNOSIS — Z09 Encounter for follow-up examination after completed treatment for conditions other than malignant neoplasm: Secondary | ICD-10-CM | POA: Diagnosis not present

## 2017-03-13 DIAGNOSIS — I482 Chronic atrial fibrillation: Secondary | ICD-10-CM | POA: Diagnosis not present

## 2017-03-14 DIAGNOSIS — I5032 Chronic diastolic (congestive) heart failure: Secondary | ICD-10-CM | POA: Diagnosis not present

## 2017-03-14 DIAGNOSIS — S51801D Unspecified open wound of right forearm, subsequent encounter: Secondary | ICD-10-CM | POA: Diagnosis not present

## 2017-03-14 DIAGNOSIS — I130 Hypertensive heart and chronic kidney disease with heart failure and stage 1 through stage 4 chronic kidney disease, or unspecified chronic kidney disease: Secondary | ICD-10-CM | POA: Diagnosis not present

## 2017-03-14 DIAGNOSIS — N189 Chronic kidney disease, unspecified: Secondary | ICD-10-CM | POA: Diagnosis not present

## 2017-03-14 DIAGNOSIS — L989 Disorder of the skin and subcutaneous tissue, unspecified: Secondary | ICD-10-CM | POA: Diagnosis not present

## 2017-03-14 DIAGNOSIS — E1122 Type 2 diabetes mellitus with diabetic chronic kidney disease: Secondary | ICD-10-CM | POA: Diagnosis not present

## 2017-03-20 DIAGNOSIS — S51801D Unspecified open wound of right forearm, subsequent encounter: Secondary | ICD-10-CM | POA: Diagnosis not present

## 2017-03-20 DIAGNOSIS — I130 Hypertensive heart and chronic kidney disease with heart failure and stage 1 through stage 4 chronic kidney disease, or unspecified chronic kidney disease: Secondary | ICD-10-CM | POA: Diagnosis not present

## 2017-03-20 DIAGNOSIS — L989 Disorder of the skin and subcutaneous tissue, unspecified: Secondary | ICD-10-CM | POA: Diagnosis not present

## 2017-03-20 DIAGNOSIS — E1122 Type 2 diabetes mellitus with diabetic chronic kidney disease: Secondary | ICD-10-CM | POA: Diagnosis not present

## 2017-03-20 DIAGNOSIS — L03115 Cellulitis of right lower limb: Secondary | ICD-10-CM | POA: Diagnosis not present

## 2017-03-20 DIAGNOSIS — M79604 Pain in right leg: Secondary | ICD-10-CM | POA: Diagnosis not present

## 2017-03-20 DIAGNOSIS — N189 Chronic kidney disease, unspecified: Secondary | ICD-10-CM | POA: Diagnosis not present

## 2017-03-20 DIAGNOSIS — I5032 Chronic diastolic (congestive) heart failure: Secondary | ICD-10-CM | POA: Diagnosis not present

## 2017-03-27 DIAGNOSIS — E1143 Type 2 diabetes mellitus with diabetic autonomic (poly)neuropathy: Secondary | ICD-10-CM | POA: Diagnosis not present

## 2017-03-27 DIAGNOSIS — S51801D Unspecified open wound of right forearm, subsequent encounter: Secondary | ICD-10-CM | POA: Diagnosis not present

## 2017-03-27 DIAGNOSIS — M79604 Pain in right leg: Secondary | ICD-10-CM | POA: Diagnosis not present

## 2017-03-27 DIAGNOSIS — I130 Hypertensive heart and chronic kidney disease with heart failure and stage 1 through stage 4 chronic kidney disease, or unspecified chronic kidney disease: Secondary | ICD-10-CM | POA: Diagnosis not present

## 2017-03-27 DIAGNOSIS — E1122 Type 2 diabetes mellitus with diabetic chronic kidney disease: Secondary | ICD-10-CM | POA: Diagnosis not present

## 2017-03-27 DIAGNOSIS — N189 Chronic kidney disease, unspecified: Secondary | ICD-10-CM | POA: Diagnosis not present

## 2017-03-27 DIAGNOSIS — I5032 Chronic diastolic (congestive) heart failure: Secondary | ICD-10-CM | POA: Diagnosis not present

## 2017-03-27 DIAGNOSIS — N183 Chronic kidney disease, stage 3 (moderate): Secondary | ICD-10-CM | POA: Diagnosis not present

## 2017-03-27 DIAGNOSIS — L989 Disorder of the skin and subcutaneous tissue, unspecified: Secondary | ICD-10-CM | POA: Diagnosis not present

## 2017-03-27 DIAGNOSIS — L03115 Cellulitis of right lower limb: Secondary | ICD-10-CM | POA: Diagnosis not present

## 2017-03-28 DIAGNOSIS — H01021 Squamous blepharitis right upper eyelid: Secondary | ICD-10-CM | POA: Diagnosis not present

## 2017-03-28 DIAGNOSIS — H34812 Central retinal vein occlusion, left eye, with macular edema: Secondary | ICD-10-CM | POA: Diagnosis not present

## 2017-03-28 DIAGNOSIS — H35033 Hypertensive retinopathy, bilateral: Secondary | ICD-10-CM | POA: Diagnosis not present

## 2017-04-03 DIAGNOSIS — N189 Chronic kidney disease, unspecified: Secondary | ICD-10-CM | POA: Diagnosis not present

## 2017-04-03 DIAGNOSIS — I5032 Chronic diastolic (congestive) heart failure: Secondary | ICD-10-CM | POA: Diagnosis not present

## 2017-04-03 DIAGNOSIS — S51801D Unspecified open wound of right forearm, subsequent encounter: Secondary | ICD-10-CM | POA: Diagnosis not present

## 2017-04-03 DIAGNOSIS — L989 Disorder of the skin and subcutaneous tissue, unspecified: Secondary | ICD-10-CM | POA: Diagnosis not present

## 2017-04-03 DIAGNOSIS — E1122 Type 2 diabetes mellitus with diabetic chronic kidney disease: Secondary | ICD-10-CM | POA: Diagnosis not present

## 2017-04-03 DIAGNOSIS — I130 Hypertensive heart and chronic kidney disease with heart failure and stage 1 through stage 4 chronic kidney disease, or unspecified chronic kidney disease: Secondary | ICD-10-CM | POA: Diagnosis not present

## 2017-04-04 DIAGNOSIS — L03115 Cellulitis of right lower limb: Secondary | ICD-10-CM | POA: Diagnosis not present

## 2017-04-04 DIAGNOSIS — M79604 Pain in right leg: Secondary | ICD-10-CM | POA: Diagnosis not present

## 2017-04-05 DIAGNOSIS — R6520 Severe sepsis without septic shock: Secondary | ICD-10-CM | POA: Diagnosis not present

## 2017-04-05 DIAGNOSIS — A419 Sepsis, unspecified organism: Secondary | ICD-10-CM | POA: Diagnosis not present

## 2017-04-05 DIAGNOSIS — L03115 Cellulitis of right lower limb: Secondary | ICD-10-CM | POA: Diagnosis not present

## 2017-04-06 DIAGNOSIS — I872 Venous insufficiency (chronic) (peripheral): Secondary | ICD-10-CM | POA: Diagnosis not present

## 2017-04-06 DIAGNOSIS — J449 Chronic obstructive pulmonary disease, unspecified: Secondary | ICD-10-CM | POA: Diagnosis not present

## 2017-04-06 DIAGNOSIS — L03115 Cellulitis of right lower limb: Secondary | ICD-10-CM | POA: Diagnosis not present

## 2017-04-06 DIAGNOSIS — E119 Type 2 diabetes mellitus without complications: Secondary | ICD-10-CM | POA: Diagnosis not present

## 2017-04-06 DIAGNOSIS — N289 Disorder of kidney and ureter, unspecified: Secondary | ICD-10-CM | POA: Diagnosis not present

## 2017-04-06 DIAGNOSIS — E039 Hypothyroidism, unspecified: Secondary | ICD-10-CM | POA: Diagnosis not present

## 2017-04-06 DIAGNOSIS — E113292 Type 2 diabetes mellitus with mild nonproliferative diabetic retinopathy without macular edema, left eye: Secondary | ICD-10-CM | POA: Diagnosis not present

## 2017-04-06 DIAGNOSIS — L97909 Non-pressure chronic ulcer of unspecified part of unspecified lower leg with unspecified severity: Secondary | ICD-10-CM | POA: Diagnosis not present

## 2017-04-06 DIAGNOSIS — A419 Sepsis, unspecified organism: Secondary | ICD-10-CM | POA: Diagnosis not present

## 2017-04-06 DIAGNOSIS — I509 Heart failure, unspecified: Secondary | ICD-10-CM | POA: Diagnosis not present

## 2017-04-06 DIAGNOSIS — A4159 Other Gram-negative sepsis: Secondary | ICD-10-CM | POA: Diagnosis not present

## 2017-04-06 DIAGNOSIS — I739 Peripheral vascular disease, unspecified: Secondary | ICD-10-CM | POA: Diagnosis not present

## 2017-04-06 DIAGNOSIS — E876 Hypokalemia: Secondary | ICD-10-CM | POA: Diagnosis not present

## 2017-04-06 DIAGNOSIS — R7881 Bacteremia: Secondary | ICD-10-CM | POA: Diagnosis not present

## 2017-04-06 DIAGNOSIS — B9689 Other specified bacterial agents as the cause of diseases classified elsewhere: Secondary | ICD-10-CM | POA: Diagnosis not present

## 2017-04-06 DIAGNOSIS — R6520 Severe sepsis without septic shock: Secondary | ICD-10-CM | POA: Diagnosis not present

## 2017-04-06 DIAGNOSIS — I110 Hypertensive heart disease with heart failure: Secondary | ICD-10-CM | POA: Diagnosis not present

## 2017-04-06 DIAGNOSIS — Z8679 Personal history of other diseases of the circulatory system: Secondary | ICD-10-CM | POA: Diagnosis not present

## 2017-04-06 DIAGNOSIS — E7800 Pure hypercholesterolemia, unspecified: Secondary | ICD-10-CM | POA: Diagnosis not present

## 2017-04-06 DIAGNOSIS — K219 Gastro-esophageal reflux disease without esophagitis: Secondary | ICD-10-CM | POA: Diagnosis not present

## 2017-04-06 DIAGNOSIS — Z79899 Other long term (current) drug therapy: Secondary | ICD-10-CM | POA: Diagnosis not present

## 2017-04-06 DIAGNOSIS — Z7982 Long term (current) use of aspirin: Secondary | ICD-10-CM | POA: Diagnosis not present

## 2017-04-06 DIAGNOSIS — J90 Pleural effusion, not elsewhere classified: Secondary | ICD-10-CM | POA: Diagnosis not present

## 2017-04-06 DIAGNOSIS — I83019 Varicose veins of right lower extremity with ulcer of unspecified site: Secondary | ICD-10-CM | POA: Diagnosis not present

## 2017-04-07 DIAGNOSIS — L03115 Cellulitis of right lower limb: Secondary | ICD-10-CM | POA: Diagnosis not present

## 2017-04-07 DIAGNOSIS — A419 Sepsis, unspecified organism: Secondary | ICD-10-CM | POA: Diagnosis not present

## 2017-04-11 DIAGNOSIS — L989 Disorder of the skin and subcutaneous tissue, unspecified: Secondary | ICD-10-CM | POA: Diagnosis not present

## 2017-04-11 DIAGNOSIS — S51801D Unspecified open wound of right forearm, subsequent encounter: Secondary | ICD-10-CM | POA: Diagnosis not present

## 2017-04-11 DIAGNOSIS — I130 Hypertensive heart and chronic kidney disease with heart failure and stage 1 through stage 4 chronic kidney disease, or unspecified chronic kidney disease: Secondary | ICD-10-CM | POA: Diagnosis not present

## 2017-04-11 DIAGNOSIS — E1122 Type 2 diabetes mellitus with diabetic chronic kidney disease: Secondary | ICD-10-CM | POA: Diagnosis not present

## 2017-04-11 DIAGNOSIS — I5032 Chronic diastolic (congestive) heart failure: Secondary | ICD-10-CM | POA: Diagnosis not present

## 2017-04-11 DIAGNOSIS — N189 Chronic kidney disease, unspecified: Secondary | ICD-10-CM | POA: Diagnosis not present

## 2017-04-12 DIAGNOSIS — I5032 Chronic diastolic (congestive) heart failure: Secondary | ICD-10-CM | POA: Diagnosis not present

## 2017-04-12 DIAGNOSIS — S51801D Unspecified open wound of right forearm, subsequent encounter: Secondary | ICD-10-CM | POA: Diagnosis not present

## 2017-04-12 DIAGNOSIS — L989 Disorder of the skin and subcutaneous tissue, unspecified: Secondary | ICD-10-CM | POA: Diagnosis not present

## 2017-04-12 DIAGNOSIS — N189 Chronic kidney disease, unspecified: Secondary | ICD-10-CM | POA: Diagnosis not present

## 2017-04-12 DIAGNOSIS — E1122 Type 2 diabetes mellitus with diabetic chronic kidney disease: Secondary | ICD-10-CM | POA: Diagnosis not present

## 2017-04-12 DIAGNOSIS — I130 Hypertensive heart and chronic kidney disease with heart failure and stage 1 through stage 4 chronic kidney disease, or unspecified chronic kidney disease: Secondary | ICD-10-CM | POA: Diagnosis not present

## 2017-04-17 DIAGNOSIS — E1122 Type 2 diabetes mellitus with diabetic chronic kidney disease: Secondary | ICD-10-CM | POA: Diagnosis not present

## 2017-04-17 DIAGNOSIS — I130 Hypertensive heart and chronic kidney disease with heart failure and stage 1 through stage 4 chronic kidney disease, or unspecified chronic kidney disease: Secondary | ICD-10-CM | POA: Diagnosis not present

## 2017-04-17 DIAGNOSIS — I872 Venous insufficiency (chronic) (peripheral): Secondary | ICD-10-CM | POA: Diagnosis not present

## 2017-04-17 DIAGNOSIS — L03116 Cellulitis of left lower limb: Secondary | ICD-10-CM | POA: Diagnosis not present

## 2017-04-17 DIAGNOSIS — I5032 Chronic diastolic (congestive) heart failure: Secondary | ICD-10-CM | POA: Diagnosis not present

## 2017-04-17 DIAGNOSIS — L97812 Non-pressure chronic ulcer of other part of right lower leg with fat layer exposed: Secondary | ICD-10-CM | POA: Diagnosis not present

## 2017-04-18 DIAGNOSIS — M79604 Pain in right leg: Secondary | ICD-10-CM | POA: Diagnosis not present

## 2017-04-18 DIAGNOSIS — D72829 Elevated white blood cell count, unspecified: Secondary | ICD-10-CM | POA: Diagnosis not present

## 2017-04-18 DIAGNOSIS — Z9189 Other specified personal risk factors, not elsewhere classified: Secondary | ICD-10-CM | POA: Diagnosis not present

## 2017-04-18 DIAGNOSIS — L03115 Cellulitis of right lower limb: Secondary | ICD-10-CM | POA: Diagnosis not present

## 2017-04-18 DIAGNOSIS — L03116 Cellulitis of left lower limb: Secondary | ICD-10-CM | POA: Diagnosis not present

## 2017-04-19 DIAGNOSIS — E1122 Type 2 diabetes mellitus with diabetic chronic kidney disease: Secondary | ICD-10-CM | POA: Diagnosis not present

## 2017-04-19 DIAGNOSIS — I130 Hypertensive heart and chronic kidney disease with heart failure and stage 1 through stage 4 chronic kidney disease, or unspecified chronic kidney disease: Secondary | ICD-10-CM | POA: Diagnosis not present

## 2017-04-19 DIAGNOSIS — I872 Venous insufficiency (chronic) (peripheral): Secondary | ICD-10-CM | POA: Diagnosis not present

## 2017-04-19 DIAGNOSIS — L03116 Cellulitis of left lower limb: Secondary | ICD-10-CM | POA: Diagnosis not present

## 2017-04-19 DIAGNOSIS — I5032 Chronic diastolic (congestive) heart failure: Secondary | ICD-10-CM | POA: Diagnosis not present

## 2017-04-19 DIAGNOSIS — L97812 Non-pressure chronic ulcer of other part of right lower leg with fat layer exposed: Secondary | ICD-10-CM | POA: Diagnosis not present

## 2017-04-23 DIAGNOSIS — L97812 Non-pressure chronic ulcer of other part of right lower leg with fat layer exposed: Secondary | ICD-10-CM | POA: Diagnosis not present

## 2017-04-23 DIAGNOSIS — I5032 Chronic diastolic (congestive) heart failure: Secondary | ICD-10-CM | POA: Diagnosis not present

## 2017-04-23 DIAGNOSIS — E1122 Type 2 diabetes mellitus with diabetic chronic kidney disease: Secondary | ICD-10-CM | POA: Diagnosis not present

## 2017-04-23 DIAGNOSIS — I130 Hypertensive heart and chronic kidney disease with heart failure and stage 1 through stage 4 chronic kidney disease, or unspecified chronic kidney disease: Secondary | ICD-10-CM | POA: Diagnosis not present

## 2017-04-23 DIAGNOSIS — I872 Venous insufficiency (chronic) (peripheral): Secondary | ICD-10-CM | POA: Diagnosis not present

## 2017-04-23 DIAGNOSIS — L03116 Cellulitis of left lower limb: Secondary | ICD-10-CM | POA: Diagnosis not present

## 2017-04-25 DIAGNOSIS — K921 Melena: Secondary | ICD-10-CM | POA: Diagnosis not present

## 2017-04-25 DIAGNOSIS — L03115 Cellulitis of right lower limb: Secondary | ICD-10-CM | POA: Diagnosis not present

## 2017-04-25 DIAGNOSIS — M79604 Pain in right leg: Secondary | ICD-10-CM | POA: Diagnosis not present

## 2017-04-25 DIAGNOSIS — L03116 Cellulitis of left lower limb: Secondary | ICD-10-CM | POA: Diagnosis not present

## 2017-04-26 DIAGNOSIS — L03116 Cellulitis of left lower limb: Secondary | ICD-10-CM | POA: Diagnosis not present

## 2017-04-26 DIAGNOSIS — I872 Venous insufficiency (chronic) (peripheral): Secondary | ICD-10-CM | POA: Diagnosis not present

## 2017-04-26 DIAGNOSIS — I5032 Chronic diastolic (congestive) heart failure: Secondary | ICD-10-CM | POA: Diagnosis not present

## 2017-04-26 DIAGNOSIS — I130 Hypertensive heart and chronic kidney disease with heart failure and stage 1 through stage 4 chronic kidney disease, or unspecified chronic kidney disease: Secondary | ICD-10-CM | POA: Diagnosis not present

## 2017-04-26 DIAGNOSIS — E1122 Type 2 diabetes mellitus with diabetic chronic kidney disease: Secondary | ICD-10-CM | POA: Diagnosis not present

## 2017-04-26 DIAGNOSIS — L97812 Non-pressure chronic ulcer of other part of right lower leg with fat layer exposed: Secondary | ICD-10-CM | POA: Diagnosis not present

## 2017-05-01 DIAGNOSIS — M79604 Pain in right leg: Secondary | ICD-10-CM | POA: Diagnosis not present

## 2017-05-01 DIAGNOSIS — L03115 Cellulitis of right lower limb: Secondary | ICD-10-CM | POA: Diagnosis not present

## 2017-05-01 DIAGNOSIS — I130 Hypertensive heart and chronic kidney disease with heart failure and stage 1 through stage 4 chronic kidney disease, or unspecified chronic kidney disease: Secondary | ICD-10-CM | POA: Diagnosis not present

## 2017-05-01 DIAGNOSIS — L97812 Non-pressure chronic ulcer of other part of right lower leg with fat layer exposed: Secondary | ICD-10-CM | POA: Diagnosis not present

## 2017-05-01 DIAGNOSIS — I5032 Chronic diastolic (congestive) heart failure: Secondary | ICD-10-CM | POA: Diagnosis not present

## 2017-05-01 DIAGNOSIS — I872 Venous insufficiency (chronic) (peripheral): Secondary | ICD-10-CM | POA: Diagnosis not present

## 2017-05-01 DIAGNOSIS — L03116 Cellulitis of left lower limb: Secondary | ICD-10-CM | POA: Diagnosis not present

## 2017-05-01 DIAGNOSIS — E1122 Type 2 diabetes mellitus with diabetic chronic kidney disease: Secondary | ICD-10-CM | POA: Diagnosis not present

## 2017-05-03 DIAGNOSIS — I5032 Chronic diastolic (congestive) heart failure: Secondary | ICD-10-CM | POA: Diagnosis not present

## 2017-05-03 DIAGNOSIS — L03116 Cellulitis of left lower limb: Secondary | ICD-10-CM | POA: Diagnosis not present

## 2017-05-03 DIAGNOSIS — I130 Hypertensive heart and chronic kidney disease with heart failure and stage 1 through stage 4 chronic kidney disease, or unspecified chronic kidney disease: Secondary | ICD-10-CM | POA: Diagnosis not present

## 2017-05-03 DIAGNOSIS — L97812 Non-pressure chronic ulcer of other part of right lower leg with fat layer exposed: Secondary | ICD-10-CM | POA: Diagnosis not present

## 2017-05-03 DIAGNOSIS — I872 Venous insufficiency (chronic) (peripheral): Secondary | ICD-10-CM | POA: Diagnosis not present

## 2017-05-03 DIAGNOSIS — E1122 Type 2 diabetes mellitus with diabetic chronic kidney disease: Secondary | ICD-10-CM | POA: Diagnosis not present

## 2017-05-08 DIAGNOSIS — L97812 Non-pressure chronic ulcer of other part of right lower leg with fat layer exposed: Secondary | ICD-10-CM | POA: Diagnosis not present

## 2017-05-08 DIAGNOSIS — I130 Hypertensive heart and chronic kidney disease with heart failure and stage 1 through stage 4 chronic kidney disease, or unspecified chronic kidney disease: Secondary | ICD-10-CM | POA: Diagnosis not present

## 2017-05-08 DIAGNOSIS — L03116 Cellulitis of left lower limb: Secondary | ICD-10-CM | POA: Diagnosis not present

## 2017-05-08 DIAGNOSIS — I872 Venous insufficiency (chronic) (peripheral): Secondary | ICD-10-CM | POA: Diagnosis not present

## 2017-05-08 DIAGNOSIS — E1122 Type 2 diabetes mellitus with diabetic chronic kidney disease: Secondary | ICD-10-CM | POA: Diagnosis not present

## 2017-05-08 DIAGNOSIS — I5032 Chronic diastolic (congestive) heart failure: Secondary | ICD-10-CM | POA: Diagnosis not present

## 2017-05-09 DIAGNOSIS — M79604 Pain in right leg: Secondary | ICD-10-CM | POA: Diagnosis not present

## 2017-05-09 DIAGNOSIS — L03115 Cellulitis of right lower limb: Secondary | ICD-10-CM | POA: Diagnosis not present

## 2017-05-09 DIAGNOSIS — L97219 Non-pressure chronic ulcer of right calf with unspecified severity: Secondary | ICD-10-CM | POA: Diagnosis not present

## 2017-05-09 DIAGNOSIS — L03116 Cellulitis of left lower limb: Secondary | ICD-10-CM | POA: Diagnosis not present

## 2017-05-09 DIAGNOSIS — I83012 Varicose veins of right lower extremity with ulcer of calf: Secondary | ICD-10-CM | POA: Diagnosis not present

## 2017-05-10 DIAGNOSIS — L97812 Non-pressure chronic ulcer of other part of right lower leg with fat layer exposed: Secondary | ICD-10-CM | POA: Diagnosis not present

## 2017-05-10 DIAGNOSIS — I130 Hypertensive heart and chronic kidney disease with heart failure and stage 1 through stage 4 chronic kidney disease, or unspecified chronic kidney disease: Secondary | ICD-10-CM | POA: Diagnosis not present

## 2017-05-10 DIAGNOSIS — I872 Venous insufficiency (chronic) (peripheral): Secondary | ICD-10-CM | POA: Diagnosis not present

## 2017-05-10 DIAGNOSIS — L03116 Cellulitis of left lower limb: Secondary | ICD-10-CM | POA: Diagnosis not present

## 2017-05-10 DIAGNOSIS — I5032 Chronic diastolic (congestive) heart failure: Secondary | ICD-10-CM | POA: Diagnosis not present

## 2017-05-10 DIAGNOSIS — E1122 Type 2 diabetes mellitus with diabetic chronic kidney disease: Secondary | ICD-10-CM | POA: Diagnosis not present

## 2017-05-11 DIAGNOSIS — L97812 Non-pressure chronic ulcer of other part of right lower leg with fat layer exposed: Secondary | ICD-10-CM | POA: Diagnosis not present

## 2017-05-11 DIAGNOSIS — I872 Venous insufficiency (chronic) (peripheral): Secondary | ICD-10-CM | POA: Diagnosis not present

## 2017-05-11 DIAGNOSIS — I5032 Chronic diastolic (congestive) heart failure: Secondary | ICD-10-CM | POA: Diagnosis not present

## 2017-05-11 DIAGNOSIS — I130 Hypertensive heart and chronic kidney disease with heart failure and stage 1 through stage 4 chronic kidney disease, or unspecified chronic kidney disease: Secondary | ICD-10-CM | POA: Diagnosis not present

## 2017-05-11 DIAGNOSIS — E1122 Type 2 diabetes mellitus with diabetic chronic kidney disease: Secondary | ICD-10-CM | POA: Diagnosis not present

## 2017-05-11 DIAGNOSIS — L03116 Cellulitis of left lower limb: Secondary | ICD-10-CM | POA: Diagnosis not present

## 2017-05-15 DIAGNOSIS — E559 Vitamin D deficiency, unspecified: Secondary | ICD-10-CM | POA: Diagnosis not present

## 2017-05-15 DIAGNOSIS — Z1212 Encounter for screening for malignant neoplasm of rectum: Secondary | ICD-10-CM | POA: Diagnosis not present

## 2017-05-15 DIAGNOSIS — I130 Hypertensive heart and chronic kidney disease with heart failure and stage 1 through stage 4 chronic kidney disease, or unspecified chronic kidney disease: Secondary | ICD-10-CM | POA: Diagnosis not present

## 2017-05-15 DIAGNOSIS — Z0000 Encounter for general adult medical examination without abnormal findings: Secondary | ICD-10-CM | POA: Diagnosis not present

## 2017-05-15 DIAGNOSIS — E785 Hyperlipidemia, unspecified: Secondary | ICD-10-CM | POA: Diagnosis not present

## 2017-05-15 DIAGNOSIS — E1122 Type 2 diabetes mellitus with diabetic chronic kidney disease: Secondary | ICD-10-CM | POA: Diagnosis not present

## 2017-05-15 DIAGNOSIS — N183 Chronic kidney disease, stage 3 (moderate): Secondary | ICD-10-CM | POA: Diagnosis not present

## 2017-05-15 DIAGNOSIS — E039 Hypothyroidism, unspecified: Secondary | ICD-10-CM | POA: Diagnosis not present

## 2017-05-16 DIAGNOSIS — Z789 Other specified health status: Secondary | ICD-10-CM | POA: Diagnosis not present

## 2017-05-16 DIAGNOSIS — L03116 Cellulitis of left lower limb: Secondary | ICD-10-CM | POA: Diagnosis not present

## 2017-05-16 DIAGNOSIS — L97812 Non-pressure chronic ulcer of other part of right lower leg with fat layer exposed: Secondary | ICD-10-CM | POA: Diagnosis not present

## 2017-05-17 DIAGNOSIS — E1122 Type 2 diabetes mellitus with diabetic chronic kidney disease: Secondary | ICD-10-CM | POA: Diagnosis not present

## 2017-05-17 DIAGNOSIS — L97812 Non-pressure chronic ulcer of other part of right lower leg with fat layer exposed: Secondary | ICD-10-CM | POA: Diagnosis not present

## 2017-05-17 DIAGNOSIS — I130 Hypertensive heart and chronic kidney disease with heart failure and stage 1 through stage 4 chronic kidney disease, or unspecified chronic kidney disease: Secondary | ICD-10-CM | POA: Diagnosis not present

## 2017-05-17 DIAGNOSIS — I5032 Chronic diastolic (congestive) heart failure: Secondary | ICD-10-CM | POA: Diagnosis not present

## 2017-05-17 DIAGNOSIS — I872 Venous insufficiency (chronic) (peripheral): Secondary | ICD-10-CM | POA: Diagnosis not present

## 2017-05-17 DIAGNOSIS — L03116 Cellulitis of left lower limb: Secondary | ICD-10-CM | POA: Diagnosis not present

## 2017-05-25 DIAGNOSIS — L97812 Non-pressure chronic ulcer of other part of right lower leg with fat layer exposed: Secondary | ICD-10-CM | POA: Diagnosis not present

## 2017-05-25 DIAGNOSIS — L03116 Cellulitis of left lower limb: Secondary | ICD-10-CM | POA: Diagnosis not present

## 2017-05-25 DIAGNOSIS — I130 Hypertensive heart and chronic kidney disease with heart failure and stage 1 through stage 4 chronic kidney disease, or unspecified chronic kidney disease: Secondary | ICD-10-CM | POA: Diagnosis not present

## 2017-05-25 DIAGNOSIS — I5032 Chronic diastolic (congestive) heart failure: Secondary | ICD-10-CM | POA: Diagnosis not present

## 2017-05-25 DIAGNOSIS — I872 Venous insufficiency (chronic) (peripheral): Secondary | ICD-10-CM | POA: Diagnosis not present

## 2017-05-25 DIAGNOSIS — E1122 Type 2 diabetes mellitus with diabetic chronic kidney disease: Secondary | ICD-10-CM | POA: Diagnosis not present

## 2017-05-29 DIAGNOSIS — I130 Hypertensive heart and chronic kidney disease with heart failure and stage 1 through stage 4 chronic kidney disease, or unspecified chronic kidney disease: Secondary | ICD-10-CM | POA: Diagnosis not present

## 2017-05-29 DIAGNOSIS — N183 Chronic kidney disease, stage 3 (moderate): Secondary | ICD-10-CM | POA: Diagnosis not present

## 2017-05-29 DIAGNOSIS — E1122 Type 2 diabetes mellitus with diabetic chronic kidney disease: Secondary | ICD-10-CM | POA: Diagnosis not present

## 2017-05-29 DIAGNOSIS — N184 Chronic kidney disease, stage 4 (severe): Secondary | ICD-10-CM | POA: Diagnosis not present

## 2017-05-29 DIAGNOSIS — E785 Hyperlipidemia, unspecified: Secondary | ICD-10-CM | POA: Diagnosis not present

## 2017-06-01 DIAGNOSIS — L03116 Cellulitis of left lower limb: Secondary | ICD-10-CM | POA: Diagnosis not present

## 2017-06-01 DIAGNOSIS — L97812 Non-pressure chronic ulcer of other part of right lower leg with fat layer exposed: Secondary | ICD-10-CM | POA: Diagnosis not present

## 2017-06-01 DIAGNOSIS — I130 Hypertensive heart and chronic kidney disease with heart failure and stage 1 through stage 4 chronic kidney disease, or unspecified chronic kidney disease: Secondary | ICD-10-CM | POA: Diagnosis not present

## 2017-06-01 DIAGNOSIS — E1122 Type 2 diabetes mellitus with diabetic chronic kidney disease: Secondary | ICD-10-CM | POA: Diagnosis not present

## 2017-06-01 DIAGNOSIS — I872 Venous insufficiency (chronic) (peripheral): Secondary | ICD-10-CM | POA: Diagnosis not present

## 2017-06-01 DIAGNOSIS — I5032 Chronic diastolic (congestive) heart failure: Secondary | ICD-10-CM | POA: Diagnosis not present

## 2017-06-06 DIAGNOSIS — M109 Gout, unspecified: Secondary | ICD-10-CM

## 2017-06-06 DIAGNOSIS — E119 Type 2 diabetes mellitus without complications: Secondary | ICD-10-CM | POA: Insufficient documentation

## 2017-06-06 DIAGNOSIS — J449 Chronic obstructive pulmonary disease, unspecified: Secondary | ICD-10-CM

## 2017-06-06 DIAGNOSIS — M810 Age-related osteoporosis without current pathological fracture: Secondary | ICD-10-CM

## 2017-06-06 DIAGNOSIS — I10 Essential (primary) hypertension: Secondary | ICD-10-CM | POA: Insufficient documentation

## 2017-06-06 DIAGNOSIS — R296 Repeated falls: Secondary | ICD-10-CM | POA: Insufficient documentation

## 2017-06-06 DIAGNOSIS — E559 Vitamin D deficiency, unspecified: Secondary | ICD-10-CM

## 2017-06-06 DIAGNOSIS — D689 Coagulation defect, unspecified: Secondary | ICD-10-CM | POA: Insufficient documentation

## 2017-06-06 DIAGNOSIS — E785 Hyperlipidemia, unspecified: Secondary | ICD-10-CM

## 2017-06-06 DIAGNOSIS — D638 Anemia in other chronic diseases classified elsewhere: Secondary | ICD-10-CM

## 2017-06-06 DIAGNOSIS — N183 Chronic kidney disease, stage 3 unspecified: Secondary | ICD-10-CM

## 2017-06-06 DIAGNOSIS — I509 Heart failure, unspecified: Secondary | ICD-10-CM | POA: Insufficient documentation

## 2017-06-06 DIAGNOSIS — N189 Chronic kidney disease, unspecified: Secondary | ICD-10-CM | POA: Insufficient documentation

## 2017-06-06 DIAGNOSIS — M1990 Unspecified osteoarthritis, unspecified site: Secondary | ICD-10-CM | POA: Insufficient documentation

## 2017-06-06 DIAGNOSIS — H269 Unspecified cataract: Secondary | ICD-10-CM | POA: Insufficient documentation

## 2017-06-06 DIAGNOSIS — I2699 Other pulmonary embolism without acute cor pulmonale: Secondary | ICD-10-CM

## 2017-06-06 DIAGNOSIS — K8050 Calculus of bile duct without cholangitis or cholecystitis without obstruction: Secondary | ICD-10-CM

## 2017-06-06 DIAGNOSIS — E079 Disorder of thyroid, unspecified: Secondary | ICD-10-CM | POA: Insufficient documentation

## 2017-06-06 DIAGNOSIS — E1140 Type 2 diabetes mellitus with diabetic neuropathy, unspecified: Secondary | ICD-10-CM

## 2017-06-06 HISTORY — DX: Gout, unspecified: M10.9

## 2017-06-06 HISTORY — DX: Anemia in other chronic diseases classified elsewhere: D63.8

## 2017-06-06 HISTORY — DX: Other pulmonary embolism without acute cor pulmonale: I26.99

## 2017-06-06 HISTORY — DX: Calculus of bile duct without cholangitis or cholecystitis without obstruction: K80.50

## 2017-06-06 HISTORY — DX: Vitamin D deficiency, unspecified: E55.9

## 2017-06-06 HISTORY — DX: Hyperlipidemia, unspecified: E78.5

## 2017-06-06 HISTORY — DX: Chronic obstructive pulmonary disease, unspecified: J44.9

## 2017-06-06 HISTORY — DX: Type 2 diabetes mellitus with diabetic neuropathy, unspecified: E11.40

## 2017-06-06 HISTORY — DX: Repeated falls: R29.6

## 2017-06-06 HISTORY — DX: Chronic kidney disease, stage 3 unspecified: N18.30

## 2017-06-06 HISTORY — DX: Age-related osteoporosis without current pathological fracture: M81.0

## 2017-06-14 DIAGNOSIS — L97812 Non-pressure chronic ulcer of other part of right lower leg with fat layer exposed: Secondary | ICD-10-CM | POA: Diagnosis not present

## 2017-06-14 DIAGNOSIS — I130 Hypertensive heart and chronic kidney disease with heart failure and stage 1 through stage 4 chronic kidney disease, or unspecified chronic kidney disease: Secondary | ICD-10-CM | POA: Diagnosis not present

## 2017-06-14 DIAGNOSIS — I5032 Chronic diastolic (congestive) heart failure: Secondary | ICD-10-CM | POA: Diagnosis not present

## 2017-06-14 DIAGNOSIS — E1122 Type 2 diabetes mellitus with diabetic chronic kidney disease: Secondary | ICD-10-CM | POA: Diagnosis not present

## 2017-06-14 DIAGNOSIS — L03116 Cellulitis of left lower limb: Secondary | ICD-10-CM | POA: Diagnosis not present

## 2017-06-14 DIAGNOSIS — I872 Venous insufficiency (chronic) (peripheral): Secondary | ICD-10-CM | POA: Diagnosis not present

## 2017-06-20 DIAGNOSIS — E1122 Type 2 diabetes mellitus with diabetic chronic kidney disease: Secondary | ICD-10-CM | POA: Diagnosis not present

## 2017-06-20 DIAGNOSIS — I130 Hypertensive heart and chronic kidney disease with heart failure and stage 1 through stage 4 chronic kidney disease, or unspecified chronic kidney disease: Secondary | ICD-10-CM | POA: Diagnosis not present

## 2017-06-20 DIAGNOSIS — L97812 Non-pressure chronic ulcer of other part of right lower leg with fat layer exposed: Secondary | ICD-10-CM | POA: Diagnosis not present

## 2017-06-20 DIAGNOSIS — I872 Venous insufficiency (chronic) (peripheral): Secondary | ICD-10-CM | POA: Diagnosis not present

## 2017-06-20 DIAGNOSIS — L03116 Cellulitis of left lower limb: Secondary | ICD-10-CM | POA: Diagnosis not present

## 2017-06-20 DIAGNOSIS — I5032 Chronic diastolic (congestive) heart failure: Secondary | ICD-10-CM | POA: Diagnosis not present

## 2017-06-22 DIAGNOSIS — L03011 Cellulitis of right finger: Secondary | ICD-10-CM | POA: Diagnosis not present

## 2017-06-22 DIAGNOSIS — M7989 Other specified soft tissue disorders: Secondary | ICD-10-CM | POA: Diagnosis not present

## 2017-06-25 DIAGNOSIS — L03011 Cellulitis of right finger: Secondary | ICD-10-CM | POA: Diagnosis not present

## 2017-06-27 DIAGNOSIS — E1122 Type 2 diabetes mellitus with diabetic chronic kidney disease: Secondary | ICD-10-CM | POA: Diagnosis not present

## 2017-06-27 DIAGNOSIS — L97812 Non-pressure chronic ulcer of other part of right lower leg with fat layer exposed: Secondary | ICD-10-CM | POA: Diagnosis not present

## 2017-06-27 DIAGNOSIS — I130 Hypertensive heart and chronic kidney disease with heart failure and stage 1 through stage 4 chronic kidney disease, or unspecified chronic kidney disease: Secondary | ICD-10-CM | POA: Diagnosis not present

## 2017-06-27 DIAGNOSIS — L03116 Cellulitis of left lower limb: Secondary | ICD-10-CM | POA: Diagnosis not present

## 2017-06-27 DIAGNOSIS — I872 Venous insufficiency (chronic) (peripheral): Secondary | ICD-10-CM | POA: Diagnosis not present

## 2017-06-27 DIAGNOSIS — I5032 Chronic diastolic (congestive) heart failure: Secondary | ICD-10-CM | POA: Diagnosis not present

## 2017-06-28 DIAGNOSIS — Z136 Encounter for screening for cardiovascular disorders: Secondary | ICD-10-CM | POA: Diagnosis not present

## 2017-06-28 DIAGNOSIS — Z0000 Encounter for general adult medical examination without abnormal findings: Secondary | ICD-10-CM | POA: Diagnosis not present

## 2017-06-28 DIAGNOSIS — Z1389 Encounter for screening for other disorder: Secondary | ICD-10-CM | POA: Diagnosis not present

## 2017-06-28 DIAGNOSIS — E785 Hyperlipidemia, unspecified: Secondary | ICD-10-CM | POA: Diagnosis not present

## 2017-06-29 DIAGNOSIS — I509 Heart failure, unspecified: Secondary | ICD-10-CM | POA: Diagnosis not present

## 2017-06-29 DIAGNOSIS — I872 Venous insufficiency (chronic) (peripheral): Secondary | ICD-10-CM | POA: Diagnosis not present

## 2017-06-29 DIAGNOSIS — Z789 Other specified health status: Secondary | ICD-10-CM | POA: Diagnosis not present

## 2017-07-03 DIAGNOSIS — L03116 Cellulitis of left lower limb: Secondary | ICD-10-CM | POA: Diagnosis not present

## 2017-07-03 DIAGNOSIS — I130 Hypertensive heart and chronic kidney disease with heart failure and stage 1 through stage 4 chronic kidney disease, or unspecified chronic kidney disease: Secondary | ICD-10-CM | POA: Diagnosis not present

## 2017-07-03 DIAGNOSIS — E1122 Type 2 diabetes mellitus with diabetic chronic kidney disease: Secondary | ICD-10-CM | POA: Diagnosis not present

## 2017-07-03 DIAGNOSIS — I872 Venous insufficiency (chronic) (peripheral): Secondary | ICD-10-CM | POA: Diagnosis not present

## 2017-07-03 DIAGNOSIS — L97812 Non-pressure chronic ulcer of other part of right lower leg with fat layer exposed: Secondary | ICD-10-CM | POA: Diagnosis not present

## 2017-07-03 DIAGNOSIS — I5032 Chronic diastolic (congestive) heart failure: Secondary | ICD-10-CM | POA: Diagnosis not present

## 2017-07-10 ENCOUNTER — Other Ambulatory Visit: Payer: Self-pay | Admitting: *Deleted

## 2017-07-10 DIAGNOSIS — I130 Hypertensive heart and chronic kidney disease with heart failure and stage 1 through stage 4 chronic kidney disease, or unspecified chronic kidney disease: Secondary | ICD-10-CM | POA: Diagnosis not present

## 2017-07-10 DIAGNOSIS — L03116 Cellulitis of left lower limb: Secondary | ICD-10-CM | POA: Diagnosis not present

## 2017-07-10 DIAGNOSIS — I872 Venous insufficiency (chronic) (peripheral): Secondary | ICD-10-CM | POA: Diagnosis not present

## 2017-07-10 DIAGNOSIS — I5032 Chronic diastolic (congestive) heart failure: Secondary | ICD-10-CM | POA: Diagnosis not present

## 2017-07-10 DIAGNOSIS — L97812 Non-pressure chronic ulcer of other part of right lower leg with fat layer exposed: Secondary | ICD-10-CM | POA: Diagnosis not present

## 2017-07-10 DIAGNOSIS — E1122 Type 2 diabetes mellitus with diabetic chronic kidney disease: Secondary | ICD-10-CM | POA: Diagnosis not present

## 2017-07-11 ENCOUNTER — Other Ambulatory Visit: Payer: Self-pay | Admitting: *Deleted

## 2017-07-27 ENCOUNTER — Encounter: Payer: Self-pay | Admitting: Cardiology

## 2017-07-27 ENCOUNTER — Ambulatory Visit: Payer: Medicare Other | Admitting: Cardiology

## 2017-07-27 VITALS — BP 140/90 | HR 88 | Resp 17 | Ht 59.0 in | Wt 150.0 lb

## 2017-07-27 DIAGNOSIS — E1122 Type 2 diabetes mellitus with diabetic chronic kidney disease: Secondary | ICD-10-CM | POA: Diagnosis not present

## 2017-07-27 DIAGNOSIS — I482 Chronic atrial fibrillation, unspecified: Secondary | ICD-10-CM

## 2017-07-27 DIAGNOSIS — L03116 Cellulitis of left lower limb: Secondary | ICD-10-CM | POA: Diagnosis not present

## 2017-07-27 DIAGNOSIS — N183 Chronic kidney disease, stage 3 unspecified: Secondary | ICD-10-CM

## 2017-07-27 DIAGNOSIS — L97812 Non-pressure chronic ulcer of other part of right lower leg with fat layer exposed: Secondary | ICD-10-CM | POA: Diagnosis not present

## 2017-07-27 DIAGNOSIS — I110 Hypertensive heart disease with heart failure: Secondary | ICD-10-CM | POA: Diagnosis not present

## 2017-07-27 DIAGNOSIS — I872 Venous insufficiency (chronic) (peripheral): Secondary | ICD-10-CM | POA: Diagnosis not present

## 2017-07-27 DIAGNOSIS — I130 Hypertensive heart and chronic kidney disease with heart failure and stage 1 through stage 4 chronic kidney disease, or unspecified chronic kidney disease: Secondary | ICD-10-CM | POA: Diagnosis not present

## 2017-07-27 DIAGNOSIS — I5032 Chronic diastolic (congestive) heart failure: Secondary | ICD-10-CM

## 2017-07-27 NOTE — Progress Notes (Signed)
Cardiology Office Note:    Date:  07/27/2017   ID:  Felicia Guzman, DOB July 14, 1920, MRN [id number]  PCP:  Melony Overly, MD  Cardiologist:  Shirlee More, MD    Referring MD: Melony Overly, MD    ASSESSMENT:    1. Chronic atrial fibrillation ([place])   2. Chronic diastolic heart failure ([place])   3. Hypertensive heart disease with heart failure ([place])   4. CKD (chronic kidney disease) stage 3, GFR 30-59 ml/min (HCC)    PLAN:    In order of problems listed above:  1. Stable rate controlled continue her beta-blocker and again after discussion she is form that she does not want anticoagulation and will continue low-dose aspirin 2. Stable compensated continue her current diuretic, recent labs requested from PCP 3. Blood pressure stable continue current treatment diuretic beta-blocker 4. Recent labs requested with diuretic therapy   Next appointment: 6 months   Medication Adjustments/Labs and Tests Ordered: Current medicines are reviewed at length with the patient today.  Concerns regarding medicines are outlined above.  Orders Placed This Encounter  Procedures  . EKG 12-Lead   No orders of the defined types were placed in this encounter.   Chief Complaint  Patient presents with  . Atrial Fibrillation    History of Present Illness:    Felicia Guzman is a 83 y.o. female with a hx of diastolic CHF, Chronic Atrial Fibrillation not anticoagulated with falls and her personal choice, and HTN  last seen 6 months ago. Compliance with diet, lifestyle and medications: Yes Overall she is pleased with the quality of her life remains independent she has had no falls No palpitation no syncope or TIA no chest pain or shortness of breath. Past Medical History:  Diagnosis Date  . Anemia, chronic disease 06/06/2017  . Arthritis   . Cataract   . CHF (congestive heart failure) ([place])   . Choledocholithiasis 06/06/2017  . Chronic anticoagulation 05/03/2015  . Chronic atrial fibrillation  ([place]) 05/03/2015   Overview:  CHADS2 vasc =6. She refuses an [place]  . Chronic diastolic heart failure ([place]) 05/03/2015  . Chronic kidney disease   . CKD (chronic kidney disease) stage 3, GFR 30-59 ml/min (HCC) 06/06/2017  . Clotting disorder ([place])   . COPD (chronic obstructive pulmonary disease) ([place]) 06/06/2017  . Diabetes mellitus without complication ([place])   . Frequent falls 06/06/2017  . Gout 06/06/2017  . Hyperlipidemia 06/06/2017  . Hypertension   . Hypertensive heart disease with heart failure ([place]) 05/03/2015  . Osteoporosis 06/06/2017  . PE (pulmonary thromboembolism) ([place]) 06/06/2017  . Primary osteoarthritis of right knee 10/25/2015  . Thyroid disease   . Type 2 diabetes, controlled, with neuropathy ([place]) 06/06/2017  . Vitamin D deficiency 06/06/2017    Past Surgical History:  Procedure Laterality Date  . ABDOMINAL HYSTERECTOMY    . APPENDECTOMY    . CHOLECYSTECTOMY    . ERCP    . EYE SURGERY    . TOTAL KNEE ARTHROPLASTY      Current Medications: Current Meds  Medication Sig  . allopurinol (ZYLOPRIM) 100 MG tablet Take 100 mg by mouth.  Marland Kitchen aspirin EC 81 MG tablet Take 81 mg by mouth.  . beta carotene w/minerals (OCUVITE) tablet Take 1 tablet by mouth daily.  . brimonidine (ALPHAGAN) 0.2 % ophthalmic solution   . calcitRIOL (ROCALTROL) 0.25 MCG capsule Take 0.25 mcg by mouth daily.  . carvedilol (COREG) 12.5 MG tablet Take 12.5 mg by mouth 2 (two) times daily  with a meal.  . dorzolamide-timolol (COSOPT) 22.3-6.8 MG/ML ophthalmic solution 1 drop Two (2) times a day.  . furosemide (LASIX) 40 MG tablet Take 40 mg by mouth.  . Lancets MISC by Does not apply route.  Marland Kitchen levothyroxine (SYNTHROID, LEVOTHROID) 88 MCG tablet Take 88 mcg by mouth daily before breakfast.  . metoprolol tartrate (LOPRESSOR) 50 MG tablet Take 50 mg by mouth.  . omega-3 acid ethyl esters (LOVAZA) 1 G capsule Take 1,200 mg by mouth 2 (two) times daily.  . sitaGLIPtin (JANUVIA) 50 MG tablet Take 50 mg by mouth  daily.  . ursodiol (ACTIGALL) 300 MG capsule Take 300 mg by mouth.  . Vitamin D, Ergocalciferol, (DRISDOL) 50000 UNITS CAPS capsule Take 50,000 Units by mouth every 7 (seven) days.     Allergies:   Buprenorphine hcl and Morphine and related   Social History   Socioeconomic History  . Marital status: Widowed    Spouse name: None  . Number of children: None  . Years of education: None  . Highest education level: None  Social Needs  . Financial resource strain: None  . Food insecurity - worry: None  . Food insecurity - inability: None  . Transportation needs - medical: None  . Transportation needs - non-medical: None  Occupational History  . None  Tobacco Use  . Smoking status: Never Smoker  . Smokeless tobacco: Never Used  Substance and Sexual Activity  . Alcohol use: No  . Drug use: No  . Sexual activity: None  Other Topics Concern  . None  Social History Narrative  . None     Family History: The patient's family history includes Heart attack in her father; Hypertension in her father; Stroke in her father. ROS:   Please see the history of present illness.    All other systems reviewed and are negative.  EKGs/Labs/Other Studies Reviewed:    The following studies were reviewed today:  EKG:  EKG ordered today.  The ekg ordered today demonstrates atrial fibrillation with controlled rate  Recent Labs: No results found for requested labs within last 8760 hours.  Recent Lipid Panel No results found for: CHOL, TRIG, HDL, CHOLHDL, VLDL, LDLCALC, LDLDIRECT  Physical Exam:    VS:  BP 140/90 (BP Location: Right Arm, Patient Position: Sitting, Cuff Size: Normal)   Pulse 88   Resp 17   Ht 4\' 11"  (1.499 m)   Wt 150 lb (68 kg)   SpO2 97%   BMI 30.30 kg/m     Wt Readings from Last 3 Encounters:  07/27/17 150 lb (68 kg)     GEN: She appears her age she is not frail well nourished, well developed in no acute distress HEENT: Normal NECK: No JVD; No carotid  bruits LYMPHATICS: No lymphadenopathy CARDIAC: Irregular irregular with variable first heart sound no murmur  RESPIRATORY:  Clear to auscultation without rales, wheezing or rhonchi  ABDOMEN: Soft, non-tender, non-distended MUSCULOSKELETAL:  No edema; No deformity  SKIN: Warm and dry NEUROLOGIC:  Alert and oriented x 3 PSYCHIATRIC:  Normal affect    Signed, Shirlee More, MD  07/27/2017 12:32 PM    [facility]

## 2017-07-27 NOTE — Patient Instructions (Signed)

## 2017-08-01 DIAGNOSIS — H34812 Central retinal vein occlusion, left eye, with macular edema: Secondary | ICD-10-CM | POA: Diagnosis not present

## 2017-09-13 DIAGNOSIS — L03115 Cellulitis of right lower limb: Secondary | ICD-10-CM | POA: Diagnosis not present

## 2017-09-13 DIAGNOSIS — R531 Weakness: Secondary | ICD-10-CM | POA: Diagnosis not present

## 2017-10-31 DIAGNOSIS — H34812 Central retinal vein occlusion, left eye, with macular edema: Secondary | ICD-10-CM | POA: Diagnosis not present

## 2017-11-13 DIAGNOSIS — L400 Psoriasis vulgaris: Secondary | ICD-10-CM | POA: Diagnosis not present

## 2017-11-21 DIAGNOSIS — L03119 Cellulitis of unspecified part of limb: Secondary | ICD-10-CM | POA: Diagnosis not present

## 2017-11-26 DIAGNOSIS — N184 Chronic kidney disease, stage 4 (severe): Secondary | ICD-10-CM | POA: Diagnosis not present

## 2017-11-26 DIAGNOSIS — E785 Hyperlipidemia, unspecified: Secondary | ICD-10-CM | POA: Diagnosis not present

## 2017-11-26 DIAGNOSIS — L03119 Cellulitis of unspecified part of limb: Secondary | ICD-10-CM | POA: Diagnosis not present

## 2017-11-26 DIAGNOSIS — I130 Hypertensive heart and chronic kidney disease with heart failure and stage 1 through stage 4 chronic kidney disease, or unspecified chronic kidney disease: Secondary | ICD-10-CM | POA: Diagnosis not present

## 2017-11-26 DIAGNOSIS — E039 Hypothyroidism, unspecified: Secondary | ICD-10-CM | POA: Diagnosis not present

## 2017-11-26 DIAGNOSIS — N183 Chronic kidney disease, stage 3 (moderate): Secondary | ICD-10-CM | POA: Diagnosis not present

## 2017-11-26 DIAGNOSIS — E1122 Type 2 diabetes mellitus with diabetic chronic kidney disease: Secondary | ICD-10-CM | POA: Diagnosis not present

## 2017-11-30 DIAGNOSIS — L0390 Cellulitis, unspecified: Secondary | ICD-10-CM | POA: Diagnosis not present

## 2017-11-30 DIAGNOSIS — R112 Nausea with vomiting, unspecified: Secondary | ICD-10-CM | POA: Diagnosis not present

## 2017-12-11 DIAGNOSIS — L400 Psoriasis vulgaris: Secondary | ICD-10-CM | POA: Diagnosis not present

## 2017-12-14 ENCOUNTER — Telehealth: Payer: Self-pay | Admitting: Internal Medicine

## 2017-12-14 DIAGNOSIS — I82412 Acute embolism and thrombosis of left femoral vein: Secondary | ICD-10-CM | POA: Diagnosis not present

## 2017-12-14 DIAGNOSIS — L0390 Cellulitis, unspecified: Secondary | ICD-10-CM | POA: Diagnosis not present

## 2017-12-14 DIAGNOSIS — I82409 Acute embolism and thrombosis of unspecified deep veins of unspecified lower extremity: Secondary | ICD-10-CM | POA: Diagnosis not present

## 2017-12-14 DIAGNOSIS — M7989 Other specified soft tissue disorders: Secondary | ICD-10-CM | POA: Diagnosis not present

## 2017-12-14 DIAGNOSIS — I2609 Other pulmonary embolism with acute cor pulmonale: Secondary | ICD-10-CM | POA: Diagnosis not present

## 2017-12-14 DIAGNOSIS — I82432 Acute embolism and thrombosis of left popliteal vein: Secondary | ICD-10-CM | POA: Diagnosis not present

## 2017-12-14 DIAGNOSIS — I4891 Unspecified atrial fibrillation: Secondary | ICD-10-CM | POA: Diagnosis not present

## 2017-12-14 DIAGNOSIS — M79661 Pain in right lower leg: Secondary | ICD-10-CM | POA: Diagnosis not present

## 2017-12-14 DIAGNOSIS — I82402 Acute embolism and thrombosis of unspecified deep veins of left lower extremity: Secondary | ICD-10-CM | POA: Diagnosis not present

## 2017-12-14 DIAGNOSIS — I2699 Other pulmonary embolism without acute cor pulmonale: Secondary | ICD-10-CM | POA: Diagnosis not present

## 2017-12-15 DIAGNOSIS — Z79899 Other long term (current) drug therapy: Secondary | ICD-10-CM | POA: Diagnosis not present

## 2017-12-15 DIAGNOSIS — I2699 Other pulmonary embolism without acute cor pulmonale: Secondary | ICD-10-CM | POA: Diagnosis not present

## 2017-12-15 DIAGNOSIS — I2609 Other pulmonary embolism with acute cor pulmonale: Secondary | ICD-10-CM | POA: Diagnosis not present

## 2017-12-15 DIAGNOSIS — E039 Hypothyroidism, unspecified: Secondary | ICD-10-CM | POA: Diagnosis not present

## 2017-12-15 DIAGNOSIS — K219 Gastro-esophageal reflux disease without esophagitis: Secondary | ICD-10-CM | POA: Diagnosis not present

## 2017-12-15 DIAGNOSIS — I5032 Chronic diastolic (congestive) heart failure: Secondary | ICD-10-CM | POA: Diagnosis not present

## 2017-12-15 DIAGNOSIS — I739 Peripheral vascular disease, unspecified: Secondary | ICD-10-CM | POA: Diagnosis not present

## 2017-12-15 DIAGNOSIS — N183 Chronic kidney disease, stage 3 (moderate): Secondary | ICD-10-CM | POA: Diagnosis not present

## 2017-12-15 DIAGNOSIS — Z8639 Personal history of other endocrine, nutritional and metabolic disease: Secondary | ICD-10-CM | POA: Diagnosis not present

## 2017-12-15 DIAGNOSIS — L03115 Cellulitis of right lower limb: Secondary | ICD-10-CM | POA: Diagnosis not present

## 2017-12-15 DIAGNOSIS — I482 Chronic atrial fibrillation: Secondary | ICD-10-CM | POA: Diagnosis not present

## 2017-12-15 DIAGNOSIS — Z9181 History of falling: Secondary | ICD-10-CM | POA: Diagnosis not present

## 2017-12-15 DIAGNOSIS — I130 Hypertensive heart and chronic kidney disease with heart failure and stage 1 through stage 4 chronic kidney disease, or unspecified chronic kidney disease: Secondary | ICD-10-CM | POA: Diagnosis not present

## 2017-12-15 DIAGNOSIS — M109 Gout, unspecified: Secondary | ICD-10-CM | POA: Diagnosis not present

## 2017-12-15 DIAGNOSIS — M79661 Pain in right lower leg: Secondary | ICD-10-CM | POA: Diagnosis not present

## 2017-12-15 DIAGNOSIS — D696 Thrombocytopenia, unspecified: Secondary | ICD-10-CM | POA: Diagnosis not present

## 2017-12-15 DIAGNOSIS — I4891 Unspecified atrial fibrillation: Secondary | ICD-10-CM | POA: Diagnosis not present

## 2017-12-15 DIAGNOSIS — I82402 Acute embolism and thrombosis of unspecified deep veins of left lower extremity: Secondary | ICD-10-CM | POA: Diagnosis not present

## 2017-12-15 DIAGNOSIS — I878 Other specified disorders of veins: Secondary | ICD-10-CM | POA: Diagnosis not present

## 2017-12-15 DIAGNOSIS — M7989 Other specified soft tissue disorders: Secondary | ICD-10-CM | POA: Diagnosis not present

## 2017-12-15 DIAGNOSIS — L0390 Cellulitis, unspecified: Secondary | ICD-10-CM | POA: Diagnosis not present

## 2017-12-15 DIAGNOSIS — I82412 Acute embolism and thrombosis of left femoral vein: Secondary | ICD-10-CM | POA: Diagnosis not present

## 2017-12-19 DIAGNOSIS — I82402 Acute embolism and thrombosis of unspecified deep veins of left lower extremity: Secondary | ICD-10-CM | POA: Diagnosis not present

## 2017-12-19 DIAGNOSIS — I4891 Unspecified atrial fibrillation: Secondary | ICD-10-CM | POA: Diagnosis not present

## 2017-12-19 DIAGNOSIS — I2699 Other pulmonary embolism without acute cor pulmonale: Secondary | ICD-10-CM | POA: Diagnosis not present

## 2017-12-19 DIAGNOSIS — L0390 Cellulitis, unspecified: Secondary | ICD-10-CM | POA: Diagnosis not present

## 2017-12-21 DIAGNOSIS — I482 Chronic atrial fibrillation: Secondary | ICD-10-CM | POA: Diagnosis not present

## 2017-12-21 DIAGNOSIS — M1991 Primary osteoarthritis, unspecified site: Secondary | ICD-10-CM | POA: Diagnosis not present

## 2017-12-21 DIAGNOSIS — I82412 Acute embolism and thrombosis of left femoral vein: Secondary | ICD-10-CM | POA: Diagnosis not present

## 2017-12-21 DIAGNOSIS — I2699 Other pulmonary embolism without acute cor pulmonale: Secondary | ICD-10-CM | POA: Diagnosis not present

## 2017-12-21 DIAGNOSIS — E039 Hypothyroidism, unspecified: Secondary | ICD-10-CM | POA: Diagnosis not present

## 2017-12-21 DIAGNOSIS — I130 Hypertensive heart and chronic kidney disease with heart failure and stage 1 through stage 4 chronic kidney disease, or unspecified chronic kidney disease: Secondary | ICD-10-CM | POA: Diagnosis not present

## 2017-12-21 DIAGNOSIS — J45909 Unspecified asthma, uncomplicated: Secondary | ICD-10-CM | POA: Diagnosis not present

## 2017-12-21 DIAGNOSIS — I509 Heart failure, unspecified: Secondary | ICD-10-CM | POA: Diagnosis not present

## 2017-12-21 DIAGNOSIS — M109 Gout, unspecified: Secondary | ICD-10-CM | POA: Diagnosis not present

## 2017-12-21 DIAGNOSIS — Z7901 Long term (current) use of anticoagulants: Secondary | ICD-10-CM | POA: Diagnosis not present

## 2017-12-21 DIAGNOSIS — I872 Venous insufficiency (chronic) (peripheral): Secondary | ICD-10-CM | POA: Diagnosis not present

## 2017-12-21 DIAGNOSIS — I82439 Acute embolism and thrombosis of unspecified popliteal vein: Secondary | ICD-10-CM | POA: Diagnosis not present

## 2017-12-21 DIAGNOSIS — I5032 Chronic diastolic (congestive) heart failure: Secondary | ICD-10-CM | POA: Diagnosis not present

## 2017-12-21 DIAGNOSIS — N183 Chronic kidney disease, stage 3 (moderate): Secondary | ICD-10-CM | POA: Diagnosis not present

## 2017-12-21 DIAGNOSIS — D696 Thrombocytopenia, unspecified: Secondary | ICD-10-CM | POA: Diagnosis not present

## 2017-12-21 DIAGNOSIS — I82401 Acute embolism and thrombosis of unspecified deep veins of right lower extremity: Secondary | ICD-10-CM | POA: Diagnosis not present

## 2017-12-24 DIAGNOSIS — I82439 Acute embolism and thrombosis of unspecified popliteal vein: Secondary | ICD-10-CM | POA: Diagnosis not present

## 2017-12-24 DIAGNOSIS — M1991 Primary osteoarthritis, unspecified site: Secondary | ICD-10-CM | POA: Diagnosis not present

## 2017-12-24 DIAGNOSIS — I82412 Acute embolism and thrombosis of left femoral vein: Secondary | ICD-10-CM | POA: Diagnosis not present

## 2017-12-24 DIAGNOSIS — N183 Chronic kidney disease, stage 3 (moderate): Secondary | ICD-10-CM | POA: Diagnosis not present

## 2017-12-24 DIAGNOSIS — I130 Hypertensive heart and chronic kidney disease with heart failure and stage 1 through stage 4 chronic kidney disease, or unspecified chronic kidney disease: Secondary | ICD-10-CM | POA: Diagnosis not present

## 2017-12-24 DIAGNOSIS — E039 Hypothyroidism, unspecified: Secondary | ICD-10-CM | POA: Diagnosis not present

## 2017-12-24 DIAGNOSIS — J45909 Unspecified asthma, uncomplicated: Secondary | ICD-10-CM | POA: Diagnosis not present

## 2017-12-24 DIAGNOSIS — I482 Chronic atrial fibrillation: Secondary | ICD-10-CM | POA: Diagnosis not present

## 2017-12-24 DIAGNOSIS — D696 Thrombocytopenia, unspecified: Secondary | ICD-10-CM | POA: Diagnosis not present

## 2017-12-24 DIAGNOSIS — I872 Venous insufficiency (chronic) (peripheral): Secondary | ICD-10-CM | POA: Diagnosis not present

## 2017-12-24 DIAGNOSIS — M109 Gout, unspecified: Secondary | ICD-10-CM | POA: Diagnosis not present

## 2017-12-24 DIAGNOSIS — I2699 Other pulmonary embolism without acute cor pulmonale: Secondary | ICD-10-CM | POA: Diagnosis not present

## 2017-12-24 DIAGNOSIS — I82401 Acute embolism and thrombosis of unspecified deep veins of right lower extremity: Secondary | ICD-10-CM | POA: Diagnosis not present

## 2017-12-24 DIAGNOSIS — Z7901 Long term (current) use of anticoagulants: Secondary | ICD-10-CM | POA: Diagnosis not present

## 2017-12-24 DIAGNOSIS — I5032 Chronic diastolic (congestive) heart failure: Secondary | ICD-10-CM | POA: Diagnosis not present

## 2017-12-25 DIAGNOSIS — I872 Venous insufficiency (chronic) (peripheral): Secondary | ICD-10-CM | POA: Diagnosis not present

## 2017-12-25 DIAGNOSIS — I2699 Other pulmonary embolism without acute cor pulmonale: Secondary | ICD-10-CM | POA: Diagnosis not present

## 2017-12-25 DIAGNOSIS — I130 Hypertensive heart and chronic kidney disease with heart failure and stage 1 through stage 4 chronic kidney disease, or unspecified chronic kidney disease: Secondary | ICD-10-CM | POA: Diagnosis not present

## 2017-12-25 DIAGNOSIS — M109 Gout, unspecified: Secondary | ICD-10-CM | POA: Diagnosis not present

## 2017-12-25 DIAGNOSIS — D696 Thrombocytopenia, unspecified: Secondary | ICD-10-CM | POA: Diagnosis not present

## 2017-12-25 DIAGNOSIS — Z7901 Long term (current) use of anticoagulants: Secondary | ICD-10-CM | POA: Diagnosis not present

## 2017-12-25 DIAGNOSIS — N183 Chronic kidney disease, stage 3 (moderate): Secondary | ICD-10-CM | POA: Diagnosis not present

## 2017-12-25 DIAGNOSIS — E039 Hypothyroidism, unspecified: Secondary | ICD-10-CM | POA: Diagnosis not present

## 2017-12-25 DIAGNOSIS — I82412 Acute embolism and thrombosis of left femoral vein: Secondary | ICD-10-CM | POA: Diagnosis not present

## 2017-12-25 DIAGNOSIS — I482 Chronic atrial fibrillation: Secondary | ICD-10-CM | POA: Diagnosis not present

## 2017-12-25 DIAGNOSIS — M1991 Primary osteoarthritis, unspecified site: Secondary | ICD-10-CM | POA: Diagnosis not present

## 2017-12-25 DIAGNOSIS — I5032 Chronic diastolic (congestive) heart failure: Secondary | ICD-10-CM | POA: Diagnosis not present

## 2017-12-25 DIAGNOSIS — J45909 Unspecified asthma, uncomplicated: Secondary | ICD-10-CM | POA: Diagnosis not present

## 2017-12-27 DIAGNOSIS — N183 Chronic kidney disease, stage 3 (moderate): Secondary | ICD-10-CM | POA: Diagnosis not present

## 2017-12-27 DIAGNOSIS — Z7901 Long term (current) use of anticoagulants: Secondary | ICD-10-CM | POA: Diagnosis not present

## 2017-12-27 DIAGNOSIS — J45909 Unspecified asthma, uncomplicated: Secondary | ICD-10-CM | POA: Diagnosis not present

## 2017-12-27 DIAGNOSIS — I5032 Chronic diastolic (congestive) heart failure: Secondary | ICD-10-CM | POA: Diagnosis not present

## 2017-12-27 DIAGNOSIS — I82439 Acute embolism and thrombosis of unspecified popliteal vein: Secondary | ICD-10-CM | POA: Diagnosis not present

## 2017-12-27 DIAGNOSIS — I2699 Other pulmonary embolism without acute cor pulmonale: Secondary | ICD-10-CM | POA: Diagnosis not present

## 2017-12-27 DIAGNOSIS — D696 Thrombocytopenia, unspecified: Secondary | ICD-10-CM | POA: Diagnosis not present

## 2017-12-27 DIAGNOSIS — I82412 Acute embolism and thrombosis of left femoral vein: Secondary | ICD-10-CM | POA: Diagnosis not present

## 2017-12-27 DIAGNOSIS — M109 Gout, unspecified: Secondary | ICD-10-CM | POA: Diagnosis not present

## 2017-12-27 DIAGNOSIS — I482 Chronic atrial fibrillation: Secondary | ICD-10-CM | POA: Diagnosis not present

## 2017-12-27 DIAGNOSIS — E039 Hypothyroidism, unspecified: Secondary | ICD-10-CM | POA: Diagnosis not present

## 2017-12-27 DIAGNOSIS — M1991 Primary osteoarthritis, unspecified site: Secondary | ICD-10-CM | POA: Diagnosis not present

## 2017-12-27 DIAGNOSIS — I872 Venous insufficiency (chronic) (peripheral): Secondary | ICD-10-CM | POA: Diagnosis not present

## 2017-12-27 DIAGNOSIS — I82401 Acute embolism and thrombosis of unspecified deep veins of right lower extremity: Secondary | ICD-10-CM | POA: Diagnosis not present

## 2017-12-27 DIAGNOSIS — I130 Hypertensive heart and chronic kidney disease with heart failure and stage 1 through stage 4 chronic kidney disease, or unspecified chronic kidney disease: Secondary | ICD-10-CM | POA: Diagnosis not present

## 2017-12-31 ENCOUNTER — Encounter: Payer: Self-pay | Admitting: Cardiology

## 2017-12-31 ENCOUNTER — Other Ambulatory Visit: Payer: Self-pay

## 2017-12-31 ENCOUNTER — Ambulatory Visit: Payer: Medicare Other | Admitting: Cardiology

## 2017-12-31 VITALS — BP 120/78 | HR 93 | Ht 59.0 in | Wt 141.0 lb

## 2017-12-31 DIAGNOSIS — Z7901 Long term (current) use of anticoagulants: Secondary | ICD-10-CM

## 2017-12-31 DIAGNOSIS — I110 Hypertensive heart disease with heart failure: Secondary | ICD-10-CM | POA: Diagnosis not present

## 2017-12-31 DIAGNOSIS — I482 Chronic atrial fibrillation, unspecified: Secondary | ICD-10-CM

## 2017-12-31 DIAGNOSIS — I2782 Chronic pulmonary embolism: Secondary | ICD-10-CM | POA: Diagnosis not present

## 2017-12-31 DIAGNOSIS — I5032 Chronic diastolic (congestive) heart failure: Secondary | ICD-10-CM

## 2017-12-31 MED ORDER — FUROSEMIDE 40 MG PO TABS: 40.0000 mg | ORAL_TABLET | Freq: Two times a day (BID) | ORAL | 11 refills | Status: DC

## 2017-12-31 NOTE — Progress Notes (Signed)
Cardiology Office Note:    Date:  12/31/2017   ID:  Felicia Guzman, DOB [birth year], MRN [id number]  PCP:  Melony Overly, MD  Cardiologist:  Shirlee More, MD    Referring MD: Melony Overly, MD    ASSESSMENT:    1. Chronic pulmonary embolism without acute cor pulmonale, unspecified pulmonary embolism type ([place])   2. Chronic anticoagulation   3. Chronic atrial fibrillation ([place])   4. Hypertensive heart disease with heart failure ([place])   5. Chronic diastolic heart failure (HCC)    PLAN:    In order of problems listed above:  1. Her venous thromboembolism was unprovoked and she will need indefinite anticoagulation.  An option after the first 6-8 weeks is to drop to low-dose Eliquis after initial therapy with warfarin.  Goal INR is 2.5-3. 2. Stable continue her current anticoagulant see above 3. Rate controlled continue beta-blocker anticoagulation 4. Stable compensated heart failure 5. Stable compensated continue her diuretic   Next appointment: 3 months   Medication Adjustments/Labs and Tests Ordered: Current medicines are reviewed at length with the patient today.  Concerns regarding medicines are outlined above.  No orders of the defined types were placed in this encounter.  No orders of the defined types were placed in this encounter.   Chief Complaint  Patient presents with  . Hospitalization Follow-up    per [facility] PE/DVT  . Atrial Fibrillation  . Congestive Heart Failure  . Hypertension  . Chronic Kidney Disease    History of Present Illness:    Felicia Guzman is a 84 y.o. female with a hx of diastolic CHF, Chronic Atrial Fibrillation not anticoagulated with falls and her personal choice, and HTN  last seen by me 07/27/17  ASSESSMENT:    1. Chronic atrial fibrillation ([place])   2. Chronic diastolic heart failure ([place])   3. Hypertensive heart disease with heart failure ([place])   4. CKD (chronic kidney disease) stage 3, GFR 30-59 ml/min (HCC)    PLAN:     1.    Stable rate controlled continue her beta-blocker and again after discussion she is form that she does not want anticoagulation and will continue low-dose aspirin 6. Stable compensated continue her current diuretic, recent labs requested from PCP 7. Blood pressure stable continue current treatment diuretic beta-blocker 8. Recent labs requested with diuretic therapy  She had recent DVT of RLEand PE with RV dilation on CT treated with anticoagulation. Compliance with diet, lifestyle and medications: Yes supervised by her family With pulmonary embolism she had no shortness of breath just a cough.  She is on warfarin INR is therapeutic no bleeding complication managed by her PCP.  She is having no palpitation shortness of breath or chest pain. Past Medical History:  Diagnosis Date  . Anemia, chronic disease 06/06/2017  . Arthritis   . Cataract   . CHF (congestive heart failure) ([place])   . Choledocholithiasis 06/06/2017  . Chronic anticoagulation 05/03/2015  . Chronic atrial fibrillation ([place]) 05/03/2015   Overview:  CHADS2 vasc =6. She refuses an [place]  . Chronic diastolic heart failure ([place]) 05/03/2015  . Chronic kidney disease   . CKD (chronic kidney disease) stage 3, GFR 30-59 ml/min (HCC) 06/06/2017  . Clotting disorder ([place])   . COPD (chronic obstructive pulmonary disease) ([place]) 06/06/2017  . Diabetes mellitus without complication ([place])   . Frequent falls 06/06/2017  . Gout 06/06/2017  . Hyperlipidemia 06/06/2017  . Hypertension   . Hypertensive heart disease with heart failure (  [place]) 05/03/2015  . Osteoporosis 06/06/2017  . PE (pulmonary thromboembolism) ([place]) 06/06/2017  . Primary osteoarthritis of right knee 10/25/2015  . Thyroid disease   . Type 2 diabetes, controlled, with neuropathy ([place]) 06/06/2017  . Vitamin D deficiency 06/06/2017    Past Surgical History:  Procedure Laterality Date  . ABDOMINAL HYSTERECTOMY    . APPENDECTOMY    . CHOLECYSTECTOMY    . ERCP    . EYE SURGERY     . TOTAL KNEE ARTHROPLASTY      Current Medications: Current Meds  Medication Sig  . benzonatate (TESSALON) 100 MG capsule TK 1 TO 2 CS PO Q 8 H PRF COUGH  . calcitRIOL (ROCALTROL) 0.25 MCG capsule Take 0.25 mcg by mouth daily. Takes on Mon, Tues, Wed, Thurs, and Friday  . dorzolamide-timolol (COSOPT) 22.3-6.8 MG/ML ophthalmic solution 1 drop Two (2) times a day.  . furosemide (LASIX) 40 MG tablet Take 1 tablet (40 mg total) by mouth 2 (two) times daily. Take 1 extra tablet every morning on Monday, Wednesday, Friday.  . Lancets MISC by Does not apply route.  Marland Kitchen levothyroxine (SYNTHROID, LEVOTHROID) 88 MCG tablet Take 88 mcg by mouth daily before breakfast.  . metoprolol tartrate (LOPRESSOR) 25 MG tablet TK 1 T PO BID  . montelukast (SINGULAIR) 10 MG tablet TK 1 T PO D  . MUCINEX 600 MG 12 hr tablet TK 1 T PO Q 12 H  . ranitidine (ZANTAC) 150 MG tablet take 1 tablet by mouth every 12 hours if needed for INDIGESTION  . ULORIC 40 MG tablet TK 1 T PO QD  . warfarin (COUMADIN) 2 MG tablet Take 1 mg by mouth daily.      Allergies:   Buprenorphine hcl and Morphine and related   Social History   Socioeconomic History  . Marital status: Widowed    Spouse name: Not on file  . Number of children: Not on file  . Years of education: Not on file  . Highest education level: Not on file  Occupational History  . Not on file  Social Needs  . Financial resource strain: Not on file  . Food insecurity:    Worry: Not on file    Inability: Not on file  . Transportation needs:    Medical: Not on file    Non-medical: Not on file  Tobacco Use  . Smoking status: Never Smoker  . Smokeless tobacco: Never Used  Substance and Sexual Activity  . Alcohol use: No  . Drug use: No  . Sexual activity: Not on file  Lifestyle  . Physical activity:    Days per week: Not on file    Minutes per session: Not on file  . Stress: Not on file  Relationships  . Social connections:    Talks on phone: Not on  file    Gets together: Not on file    Attends religious service: Not on file    Active member of club or organization: Not on file    Attends meetings of clubs or organizations: Not on file    Relationship status: Not on file  Other Topics Concern  . Not on file  Social History Narrative  . Not on file     Family History: The patient's family history includes Heart attack in her father; Hypertension in her father; Stroke in her father. ROS:   Please see the history of present illness.    All other systems reviewed and are negative.  EKGs/Labs/Other Studies Reviewed:  The following studies were reviewed today:  [facility] at [facility] with [facility] incomplete RBBB Echo with normal RV LV size and function, severe LAE, moderate TR  Recent Labs: Hgb 13.0, plts 138000, Cr 1.5, K 4.6, Pro BNP 5070, troponin ,0.01 No results found for requested labs within last 8760 hours.  Recent Lipid Panel No results found for: CHOL, TRIG, HDL, CHOLHDL, VLDL, LDLCALC, LDLDIRECT  Physical Exam:    VS:  BP 120/78 (BP Location: Left Arm, Patient Position: Sitting, Cuff Size: Normal)   Pulse 93   Ht 4\' 11"  (1.499 m)   Wt 141 lb (64 kg)   SpO2 97%   BMI 28.48 kg/m     Wt Readings from Last 3 Encounters:  12/31/17 141 lb (64 kg)  07/27/17 150 lb (68 kg)     GEN: She appears quite frail 2 in no acute distress HEENT: Normal NECK: No JVD; No carotid bruits LYMPHATICS: No lymphadenopathy CARDIAC: Irregular irregular variable first heart sound  murmurs, rubs, gallops RESPIRATORY:  Clear to auscultation without rales, wheezing or rhonchi  ABDOMEN: Soft, non-tender, non-distended MUSCULOSKELETAL:  1+ bilateral lower extremity edema; No deformity  SKIN: Warm and dry NEUROLOGIC:  Alert and oriented x 3 PSYCHIATRIC:  Normal affect    Signed, Shirlee More, MD  12/31/2017 3:35 PM    [facility]

## 2017-12-31 NOTE — Patient Instructions (Signed)

## 2018-01-01 DIAGNOSIS — I130 Hypertensive heart and chronic kidney disease with heart failure and stage 1 through stage 4 chronic kidney disease, or unspecified chronic kidney disease: Secondary | ICD-10-CM | POA: Diagnosis not present

## 2018-01-01 DIAGNOSIS — I482 Chronic atrial fibrillation: Secondary | ICD-10-CM | POA: Diagnosis not present

## 2018-01-01 DIAGNOSIS — I5032 Chronic diastolic (congestive) heart failure: Secondary | ICD-10-CM | POA: Diagnosis not present

## 2018-01-01 DIAGNOSIS — M1991 Primary osteoarthritis, unspecified site: Secondary | ICD-10-CM | POA: Diagnosis not present

## 2018-01-01 DIAGNOSIS — I872 Venous insufficiency (chronic) (peripheral): Secondary | ICD-10-CM | POA: Diagnosis not present

## 2018-01-01 DIAGNOSIS — M109 Gout, unspecified: Secondary | ICD-10-CM | POA: Diagnosis not present

## 2018-01-01 DIAGNOSIS — J45909 Unspecified asthma, uncomplicated: Secondary | ICD-10-CM | POA: Diagnosis not present

## 2018-01-01 DIAGNOSIS — D696 Thrombocytopenia, unspecified: Secondary | ICD-10-CM | POA: Diagnosis not present

## 2018-01-01 DIAGNOSIS — I82412 Acute embolism and thrombosis of left femoral vein: Secondary | ICD-10-CM | POA: Diagnosis not present

## 2018-01-01 DIAGNOSIS — I2699 Other pulmonary embolism without acute cor pulmonale: Secondary | ICD-10-CM | POA: Diagnosis not present

## 2018-01-01 DIAGNOSIS — Z7901 Long term (current) use of anticoagulants: Secondary | ICD-10-CM | POA: Diagnosis not present

## 2018-01-01 DIAGNOSIS — E039 Hypothyroidism, unspecified: Secondary | ICD-10-CM | POA: Diagnosis not present

## 2018-01-01 DIAGNOSIS — N183 Chronic kidney disease, stage 3 (moderate): Secondary | ICD-10-CM | POA: Diagnosis not present

## 2018-01-03 DIAGNOSIS — I82411 Acute embolism and thrombosis of right femoral vein: Secondary | ICD-10-CM | POA: Diagnosis not present

## 2018-01-03 DIAGNOSIS — I130 Hypertensive heart and chronic kidney disease with heart failure and stage 1 through stage 4 chronic kidney disease, or unspecified chronic kidney disease: Secondary | ICD-10-CM | POA: Diagnosis not present

## 2018-01-03 DIAGNOSIS — M109 Gout, unspecified: Secondary | ICD-10-CM | POA: Diagnosis not present

## 2018-01-03 DIAGNOSIS — E039 Hypothyroidism, unspecified: Secondary | ICD-10-CM | POA: Diagnosis not present

## 2018-01-03 DIAGNOSIS — I482 Chronic atrial fibrillation: Secondary | ICD-10-CM | POA: Diagnosis not present

## 2018-01-03 DIAGNOSIS — Z9181 History of falling: Secondary | ICD-10-CM | POA: Diagnosis not present

## 2018-01-03 DIAGNOSIS — I82412 Acute embolism and thrombosis of left femoral vein: Secondary | ICD-10-CM | POA: Diagnosis not present

## 2018-01-03 DIAGNOSIS — I82491 Acute embolism and thrombosis of other specified deep vein of right lower extremity: Secondary | ICD-10-CM | POA: Diagnosis not present

## 2018-01-03 DIAGNOSIS — N183 Chronic kidney disease, stage 3 (moderate): Secondary | ICD-10-CM | POA: Diagnosis not present

## 2018-01-03 DIAGNOSIS — I82441 Acute embolism and thrombosis of right tibial vein: Secondary | ICD-10-CM | POA: Diagnosis not present

## 2018-01-03 DIAGNOSIS — I2699 Other pulmonary embolism without acute cor pulmonale: Secondary | ICD-10-CM | POA: Diagnosis not present

## 2018-01-03 DIAGNOSIS — M1991 Primary osteoarthritis, unspecified site: Secondary | ICD-10-CM | POA: Diagnosis not present

## 2018-01-03 DIAGNOSIS — I82432 Acute embolism and thrombosis of left popliteal vein: Secondary | ICD-10-CM | POA: Diagnosis not present

## 2018-01-03 DIAGNOSIS — I872 Venous insufficiency (chronic) (peripheral): Secondary | ICD-10-CM | POA: Diagnosis not present

## 2018-01-03 DIAGNOSIS — N189 Chronic kidney disease, unspecified: Secondary | ICD-10-CM | POA: Diagnosis not present

## 2018-01-03 DIAGNOSIS — I82431 Acute embolism and thrombosis of right popliteal vein: Secondary | ICD-10-CM | POA: Diagnosis not present

## 2018-01-03 DIAGNOSIS — I4891 Unspecified atrial fibrillation: Secondary | ICD-10-CM | POA: Diagnosis not present

## 2018-01-03 DIAGNOSIS — J45909 Unspecified asthma, uncomplicated: Secondary | ICD-10-CM | POA: Diagnosis not present

## 2018-01-03 DIAGNOSIS — D696 Thrombocytopenia, unspecified: Secondary | ICD-10-CM | POA: Diagnosis not present

## 2018-01-03 DIAGNOSIS — I5032 Chronic diastolic (congestive) heart failure: Secondary | ICD-10-CM | POA: Diagnosis not present

## 2018-01-03 DIAGNOSIS — Z7901 Long term (current) use of anticoagulants: Secondary | ICD-10-CM | POA: Diagnosis not present

## 2018-01-07 DIAGNOSIS — I130 Hypertensive heart and chronic kidney disease with heart failure and stage 1 through stage 4 chronic kidney disease, or unspecified chronic kidney disease: Secondary | ICD-10-CM | POA: Diagnosis not present

## 2018-01-07 DIAGNOSIS — D696 Thrombocytopenia, unspecified: Secondary | ICD-10-CM | POA: Diagnosis not present

## 2018-01-07 DIAGNOSIS — M1991 Primary osteoarthritis, unspecified site: Secondary | ICD-10-CM | POA: Diagnosis not present

## 2018-01-07 DIAGNOSIS — I872 Venous insufficiency (chronic) (peripheral): Secondary | ICD-10-CM | POA: Diagnosis not present

## 2018-01-07 DIAGNOSIS — Z7901 Long term (current) use of anticoagulants: Secondary | ICD-10-CM | POA: Diagnosis not present

## 2018-01-07 DIAGNOSIS — M109 Gout, unspecified: Secondary | ICD-10-CM | POA: Diagnosis not present

## 2018-01-07 DIAGNOSIS — J45909 Unspecified asthma, uncomplicated: Secondary | ICD-10-CM | POA: Diagnosis not present

## 2018-01-07 DIAGNOSIS — I482 Chronic atrial fibrillation: Secondary | ICD-10-CM | POA: Diagnosis not present

## 2018-01-07 DIAGNOSIS — I5032 Chronic diastolic (congestive) heart failure: Secondary | ICD-10-CM | POA: Diagnosis not present

## 2018-01-07 DIAGNOSIS — E039 Hypothyroidism, unspecified: Secondary | ICD-10-CM | POA: Diagnosis not present

## 2018-01-07 DIAGNOSIS — N183 Chronic kidney disease, stage 3 (moderate): Secondary | ICD-10-CM | POA: Diagnosis not present

## 2018-01-07 DIAGNOSIS — I82412 Acute embolism and thrombosis of left femoral vein: Secondary | ICD-10-CM | POA: Diagnosis not present

## 2018-01-07 DIAGNOSIS — I2699 Other pulmonary embolism without acute cor pulmonale: Secondary | ICD-10-CM | POA: Diagnosis not present

## 2018-01-08 DIAGNOSIS — I130 Hypertensive heart and chronic kidney disease with heart failure and stage 1 through stage 4 chronic kidney disease, or unspecified chronic kidney disease: Secondary | ICD-10-CM | POA: Diagnosis not present

## 2018-01-08 DIAGNOSIS — N183 Chronic kidney disease, stage 3 (moderate): Secondary | ICD-10-CM | POA: Diagnosis not present

## 2018-01-08 DIAGNOSIS — J45909 Unspecified asthma, uncomplicated: Secondary | ICD-10-CM | POA: Diagnosis not present

## 2018-01-08 DIAGNOSIS — Z7901 Long term (current) use of anticoagulants: Secondary | ICD-10-CM | POA: Diagnosis not present

## 2018-01-08 DIAGNOSIS — E039 Hypothyroidism, unspecified: Secondary | ICD-10-CM | POA: Diagnosis not present

## 2018-01-08 DIAGNOSIS — I482 Chronic atrial fibrillation: Secondary | ICD-10-CM | POA: Diagnosis not present

## 2018-01-08 DIAGNOSIS — M109 Gout, unspecified: Secondary | ICD-10-CM | POA: Diagnosis not present

## 2018-01-08 DIAGNOSIS — I2699 Other pulmonary embolism without acute cor pulmonale: Secondary | ICD-10-CM | POA: Diagnosis not present

## 2018-01-08 DIAGNOSIS — D696 Thrombocytopenia, unspecified: Secondary | ICD-10-CM | POA: Diagnosis not present

## 2018-01-08 DIAGNOSIS — I872 Venous insufficiency (chronic) (peripheral): Secondary | ICD-10-CM | POA: Diagnosis not present

## 2018-01-08 DIAGNOSIS — I82412 Acute embolism and thrombosis of left femoral vein: Secondary | ICD-10-CM | POA: Diagnosis not present

## 2018-01-08 DIAGNOSIS — I5032 Chronic diastolic (congestive) heart failure: Secondary | ICD-10-CM | POA: Diagnosis not present

## 2018-01-08 DIAGNOSIS — M1991 Primary osteoarthritis, unspecified site: Secondary | ICD-10-CM | POA: Diagnosis not present

## 2018-01-11 DIAGNOSIS — R791 Abnormal coagulation profile: Secondary | ICD-10-CM | POA: Diagnosis not present

## 2018-01-15 DIAGNOSIS — M109 Gout, unspecified: Secondary | ICD-10-CM | POA: Diagnosis not present

## 2018-01-15 DIAGNOSIS — I482 Chronic atrial fibrillation: Secondary | ICD-10-CM | POA: Diagnosis not present

## 2018-01-15 DIAGNOSIS — I130 Hypertensive heart and chronic kidney disease with heart failure and stage 1 through stage 4 chronic kidney disease, or unspecified chronic kidney disease: Secondary | ICD-10-CM | POA: Diagnosis not present

## 2018-01-15 DIAGNOSIS — I872 Venous insufficiency (chronic) (peripheral): Secondary | ICD-10-CM | POA: Diagnosis not present

## 2018-01-15 DIAGNOSIS — E039 Hypothyroidism, unspecified: Secondary | ICD-10-CM | POA: Diagnosis not present

## 2018-01-15 DIAGNOSIS — M1991 Primary osteoarthritis, unspecified site: Secondary | ICD-10-CM | POA: Diagnosis not present

## 2018-01-15 DIAGNOSIS — I5032 Chronic diastolic (congestive) heart failure: Secondary | ICD-10-CM | POA: Diagnosis not present

## 2018-01-15 DIAGNOSIS — N183 Chronic kidney disease, stage 3 (moderate): Secondary | ICD-10-CM | POA: Diagnosis not present

## 2018-01-15 DIAGNOSIS — I82412 Acute embolism and thrombosis of left femoral vein: Secondary | ICD-10-CM | POA: Diagnosis not present

## 2018-01-15 DIAGNOSIS — Z7901 Long term (current) use of anticoagulants: Secondary | ICD-10-CM | POA: Diagnosis not present

## 2018-01-15 DIAGNOSIS — I2699 Other pulmonary embolism without acute cor pulmonale: Secondary | ICD-10-CM | POA: Diagnosis not present

## 2018-01-15 DIAGNOSIS — D696 Thrombocytopenia, unspecified: Secondary | ICD-10-CM | POA: Diagnosis not present

## 2018-01-15 DIAGNOSIS — J45909 Unspecified asthma, uncomplicated: Secondary | ICD-10-CM | POA: Diagnosis not present

## 2018-01-22 DIAGNOSIS — J45909 Unspecified asthma, uncomplicated: Secondary | ICD-10-CM | POA: Diagnosis not present

## 2018-01-22 DIAGNOSIS — I482 Chronic atrial fibrillation: Secondary | ICD-10-CM | POA: Diagnosis not present

## 2018-01-22 DIAGNOSIS — I5032 Chronic diastolic (congestive) heart failure: Secondary | ICD-10-CM | POA: Diagnosis not present

## 2018-01-22 DIAGNOSIS — I2699 Other pulmonary embolism without acute cor pulmonale: Secondary | ICD-10-CM | POA: Diagnosis not present

## 2018-01-22 DIAGNOSIS — I872 Venous insufficiency (chronic) (peripheral): Secondary | ICD-10-CM | POA: Diagnosis not present

## 2018-01-22 DIAGNOSIS — I82412 Acute embolism and thrombosis of left femoral vein: Secondary | ICD-10-CM | POA: Diagnosis not present

## 2018-01-22 DIAGNOSIS — M1991 Primary osteoarthritis, unspecified site: Secondary | ICD-10-CM | POA: Diagnosis not present

## 2018-01-22 DIAGNOSIS — Z7901 Long term (current) use of anticoagulants: Secondary | ICD-10-CM | POA: Diagnosis not present

## 2018-01-22 DIAGNOSIS — M109 Gout, unspecified: Secondary | ICD-10-CM | POA: Diagnosis not present

## 2018-01-22 DIAGNOSIS — D696 Thrombocytopenia, unspecified: Secondary | ICD-10-CM | POA: Diagnosis not present

## 2018-01-22 DIAGNOSIS — N183 Chronic kidney disease, stage 3 (moderate): Secondary | ICD-10-CM | POA: Diagnosis not present

## 2018-01-22 DIAGNOSIS — I130 Hypertensive heart and chronic kidney disease with heart failure and stage 1 through stage 4 chronic kidney disease, or unspecified chronic kidney disease: Secondary | ICD-10-CM | POA: Diagnosis not present

## 2018-01-22 DIAGNOSIS — E039 Hypothyroidism, unspecified: Secondary | ICD-10-CM | POA: Diagnosis not present

## 2018-01-23 DIAGNOSIS — Z79899 Other long term (current) drug therapy: Secondary | ICD-10-CM | POA: Diagnosis not present

## 2018-01-23 DIAGNOSIS — E1122 Type 2 diabetes mellitus with diabetic chronic kidney disease: Secondary | ICD-10-CM | POA: Diagnosis not present

## 2018-01-23 DIAGNOSIS — I82409 Acute embolism and thrombosis of unspecified deep veins of unspecified lower extremity: Secondary | ICD-10-CM | POA: Diagnosis not present

## 2018-01-23 DIAGNOSIS — I2699 Other pulmonary embolism without acute cor pulmonale: Secondary | ICD-10-CM | POA: Diagnosis not present

## 2018-01-23 DIAGNOSIS — Z1331 Encounter for screening for depression: Secondary | ICD-10-CM | POA: Diagnosis not present

## 2018-01-25 DIAGNOSIS — J45909 Unspecified asthma, uncomplicated: Secondary | ICD-10-CM | POA: Diagnosis not present

## 2018-01-25 DIAGNOSIS — M109 Gout, unspecified: Secondary | ICD-10-CM | POA: Diagnosis not present

## 2018-01-25 DIAGNOSIS — N183 Chronic kidney disease, stage 3 (moderate): Secondary | ICD-10-CM | POA: Diagnosis not present

## 2018-01-25 DIAGNOSIS — I82412 Acute embolism and thrombosis of left femoral vein: Secondary | ICD-10-CM | POA: Diagnosis not present

## 2018-01-25 DIAGNOSIS — Z7901 Long term (current) use of anticoagulants: Secondary | ICD-10-CM | POA: Diagnosis not present

## 2018-01-25 DIAGNOSIS — I872 Venous insufficiency (chronic) (peripheral): Secondary | ICD-10-CM | POA: Diagnosis not present

## 2018-01-25 DIAGNOSIS — I2699 Other pulmonary embolism without acute cor pulmonale: Secondary | ICD-10-CM | POA: Diagnosis not present

## 2018-01-25 DIAGNOSIS — I5032 Chronic diastolic (congestive) heart failure: Secondary | ICD-10-CM | POA: Diagnosis not present

## 2018-01-25 DIAGNOSIS — M1991 Primary osteoarthritis, unspecified site: Secondary | ICD-10-CM | POA: Diagnosis not present

## 2018-01-25 DIAGNOSIS — D696 Thrombocytopenia, unspecified: Secondary | ICD-10-CM | POA: Diagnosis not present

## 2018-01-25 DIAGNOSIS — E039 Hypothyroidism, unspecified: Secondary | ICD-10-CM | POA: Diagnosis not present

## 2018-01-25 DIAGNOSIS — I482 Chronic atrial fibrillation: Secondary | ICD-10-CM | POA: Diagnosis not present

## 2018-01-25 DIAGNOSIS — I130 Hypertensive heart and chronic kidney disease with heart failure and stage 1 through stage 4 chronic kidney disease, or unspecified chronic kidney disease: Secondary | ICD-10-CM | POA: Diagnosis not present

## 2018-01-28 ENCOUNTER — Ambulatory Visit: Payer: Medicare Other | Admitting: Cardiology

## 2018-01-28 DIAGNOSIS — R791 Abnormal coagulation profile: Secondary | ICD-10-CM | POA: Diagnosis not present

## 2018-01-29 DIAGNOSIS — I5032 Chronic diastolic (congestive) heart failure: Secondary | ICD-10-CM | POA: Diagnosis not present

## 2018-01-29 DIAGNOSIS — I130 Hypertensive heart and chronic kidney disease with heart failure and stage 1 through stage 4 chronic kidney disease, or unspecified chronic kidney disease: Secondary | ICD-10-CM | POA: Diagnosis not present

## 2018-01-29 DIAGNOSIS — M109 Gout, unspecified: Secondary | ICD-10-CM | POA: Diagnosis not present

## 2018-01-29 DIAGNOSIS — M1991 Primary osteoarthritis, unspecified site: Secondary | ICD-10-CM | POA: Diagnosis not present

## 2018-01-29 DIAGNOSIS — I482 Chronic atrial fibrillation: Secondary | ICD-10-CM | POA: Diagnosis not present

## 2018-01-29 DIAGNOSIS — I2699 Other pulmonary embolism without acute cor pulmonale: Secondary | ICD-10-CM | POA: Diagnosis not present

## 2018-01-29 DIAGNOSIS — N183 Chronic kidney disease, stage 3 (moderate): Secondary | ICD-10-CM | POA: Diagnosis not present

## 2018-01-29 DIAGNOSIS — D696 Thrombocytopenia, unspecified: Secondary | ICD-10-CM | POA: Diagnosis not present

## 2018-01-29 DIAGNOSIS — J45909 Unspecified asthma, uncomplicated: Secondary | ICD-10-CM | POA: Diagnosis not present

## 2018-01-29 DIAGNOSIS — I82412 Acute embolism and thrombosis of left femoral vein: Secondary | ICD-10-CM | POA: Diagnosis not present

## 2018-01-29 DIAGNOSIS — E039 Hypothyroidism, unspecified: Secondary | ICD-10-CM | POA: Diagnosis not present

## 2018-01-29 DIAGNOSIS — Z7901 Long term (current) use of anticoagulants: Secondary | ICD-10-CM | POA: Diagnosis not present

## 2018-01-29 DIAGNOSIS — I872 Venous insufficiency (chronic) (peripheral): Secondary | ICD-10-CM | POA: Diagnosis not present

## 2018-02-05 DIAGNOSIS — I2699 Other pulmonary embolism without acute cor pulmonale: Secondary | ICD-10-CM | POA: Diagnosis not present

## 2018-02-05 DIAGNOSIS — J45909 Unspecified asthma, uncomplicated: Secondary | ICD-10-CM | POA: Diagnosis not present

## 2018-02-05 DIAGNOSIS — E039 Hypothyroidism, unspecified: Secondary | ICD-10-CM | POA: Diagnosis not present

## 2018-02-05 DIAGNOSIS — I130 Hypertensive heart and chronic kidney disease with heart failure and stage 1 through stage 4 chronic kidney disease, or unspecified chronic kidney disease: Secondary | ICD-10-CM | POA: Diagnosis not present

## 2018-02-05 DIAGNOSIS — N183 Chronic kidney disease, stage 3 (moderate): Secondary | ICD-10-CM | POA: Diagnosis not present

## 2018-02-05 DIAGNOSIS — Z7901 Long term (current) use of anticoagulants: Secondary | ICD-10-CM | POA: Diagnosis not present

## 2018-02-05 DIAGNOSIS — D696 Thrombocytopenia, unspecified: Secondary | ICD-10-CM | POA: Diagnosis not present

## 2018-02-05 DIAGNOSIS — I5032 Chronic diastolic (congestive) heart failure: Secondary | ICD-10-CM | POA: Diagnosis not present

## 2018-02-05 DIAGNOSIS — I872 Venous insufficiency (chronic) (peripheral): Secondary | ICD-10-CM | POA: Diagnosis not present

## 2018-02-05 DIAGNOSIS — M109 Gout, unspecified: Secondary | ICD-10-CM | POA: Diagnosis not present

## 2018-02-05 DIAGNOSIS — I82412 Acute embolism and thrombosis of left femoral vein: Secondary | ICD-10-CM | POA: Diagnosis not present

## 2018-02-05 DIAGNOSIS — M1991 Primary osteoarthritis, unspecified site: Secondary | ICD-10-CM | POA: Diagnosis not present

## 2018-02-05 DIAGNOSIS — I482 Chronic atrial fibrillation: Secondary | ICD-10-CM | POA: Diagnosis not present

## 2018-02-11 DIAGNOSIS — D696 Thrombocytopenia, unspecified: Secondary | ICD-10-CM | POA: Diagnosis not present

## 2018-02-11 DIAGNOSIS — M1991 Primary osteoarthritis, unspecified site: Secondary | ICD-10-CM | POA: Diagnosis not present

## 2018-02-11 DIAGNOSIS — I2699 Other pulmonary embolism without acute cor pulmonale: Secondary | ICD-10-CM | POA: Diagnosis not present

## 2018-02-11 DIAGNOSIS — I5032 Chronic diastolic (congestive) heart failure: Secondary | ICD-10-CM | POA: Diagnosis not present

## 2018-02-11 DIAGNOSIS — J45909 Unspecified asthma, uncomplicated: Secondary | ICD-10-CM | POA: Diagnosis not present

## 2018-02-11 DIAGNOSIS — I82412 Acute embolism and thrombosis of left femoral vein: Secondary | ICD-10-CM | POA: Diagnosis not present

## 2018-02-11 DIAGNOSIS — M109 Gout, unspecified: Secondary | ICD-10-CM | POA: Diagnosis not present

## 2018-02-11 DIAGNOSIS — I130 Hypertensive heart and chronic kidney disease with heart failure and stage 1 through stage 4 chronic kidney disease, or unspecified chronic kidney disease: Secondary | ICD-10-CM | POA: Diagnosis not present

## 2018-02-11 DIAGNOSIS — N183 Chronic kidney disease, stage 3 (moderate): Secondary | ICD-10-CM | POA: Diagnosis not present

## 2018-02-11 DIAGNOSIS — E039 Hypothyroidism, unspecified: Secondary | ICD-10-CM | POA: Diagnosis not present

## 2018-02-11 DIAGNOSIS — I872 Venous insufficiency (chronic) (peripheral): Secondary | ICD-10-CM | POA: Diagnosis not present

## 2018-02-11 DIAGNOSIS — Z7901 Long term (current) use of anticoagulants: Secondary | ICD-10-CM | POA: Diagnosis not present

## 2018-02-11 DIAGNOSIS — I482 Chronic atrial fibrillation: Secondary | ICD-10-CM | POA: Diagnosis not present

## 2018-02-12 DIAGNOSIS — H21512 Anterior synechiae (iris), left eye: Secondary | ICD-10-CM | POA: Diagnosis not present

## 2018-02-12 DIAGNOSIS — H35033 Hypertensive retinopathy, bilateral: Secondary | ICD-10-CM | POA: Diagnosis not present

## 2018-02-12 DIAGNOSIS — H34812 Central retinal vein occlusion, left eye, with macular edema: Secondary | ICD-10-CM | POA: Diagnosis not present

## 2018-02-12 DIAGNOSIS — H401133 Primary open-angle glaucoma, bilateral, severe stage: Secondary | ICD-10-CM | POA: Diagnosis not present

## 2018-02-12 DIAGNOSIS — H2102 Hyphema, left eye: Secondary | ICD-10-CM | POA: Diagnosis not present

## 2018-02-15 DIAGNOSIS — L0390 Cellulitis, unspecified: Secondary | ICD-10-CM | POA: Diagnosis not present

## 2018-02-26 DIAGNOSIS — I2699 Other pulmonary embolism without acute cor pulmonale: Secondary | ICD-10-CM | POA: Diagnosis not present

## 2018-02-26 DIAGNOSIS — E039 Hypothyroidism, unspecified: Secondary | ICD-10-CM | POA: Diagnosis not present

## 2018-02-26 DIAGNOSIS — D649 Anemia, unspecified: Secondary | ICD-10-CM | POA: Diagnosis not present

## 2018-02-26 DIAGNOSIS — E785 Hyperlipidemia, unspecified: Secondary | ICD-10-CM | POA: Diagnosis not present

## 2018-02-26 DIAGNOSIS — E1122 Type 2 diabetes mellitus with diabetic chronic kidney disease: Secondary | ICD-10-CM | POA: Diagnosis not present

## 2018-02-26 DIAGNOSIS — E1169 Type 2 diabetes mellitus with other specified complication: Secondary | ICD-10-CM | POA: Diagnosis not present

## 2018-02-26 DIAGNOSIS — I82409 Acute embolism and thrombosis of unspecified deep veins of unspecified lower extremity: Secondary | ICD-10-CM | POA: Diagnosis not present

## 2018-03-12 DIAGNOSIS — N184 Chronic kidney disease, stage 4 (severe): Secondary | ICD-10-CM | POA: Diagnosis not present

## 2018-03-12 DIAGNOSIS — I129 Hypertensive chronic kidney disease with stage 1 through stage 4 chronic kidney disease, or unspecified chronic kidney disease: Secondary | ICD-10-CM | POA: Diagnosis not present

## 2018-03-12 DIAGNOSIS — N2581 Secondary hyperparathyroidism of renal origin: Secondary | ICD-10-CM | POA: Diagnosis not present

## 2018-03-26 DIAGNOSIS — L400 Psoriasis vulgaris: Secondary | ICD-10-CM | POA: Diagnosis not present

## 2018-05-15 DIAGNOSIS — E039 Hypothyroidism, unspecified: Secondary | ICD-10-CM | POA: Diagnosis not present

## 2018-06-05 DIAGNOSIS — D649 Anemia, unspecified: Secondary | ICD-10-CM | POA: Diagnosis not present

## 2018-06-05 DIAGNOSIS — E1122 Type 2 diabetes mellitus with diabetic chronic kidney disease: Secondary | ICD-10-CM | POA: Diagnosis not present

## 2018-06-05 DIAGNOSIS — I2699 Other pulmonary embolism without acute cor pulmonale: Secondary | ICD-10-CM | POA: Diagnosis not present

## 2018-06-05 DIAGNOSIS — M109 Gout, unspecified: Secondary | ICD-10-CM | POA: Diagnosis not present

## 2018-06-05 DIAGNOSIS — E039 Hypothyroidism, unspecified: Secondary | ICD-10-CM | POA: Diagnosis not present

## 2018-06-05 DIAGNOSIS — I82409 Acute embolism and thrombosis of unspecified deep veins of unspecified lower extremity: Secondary | ICD-10-CM | POA: Diagnosis not present

## 2018-06-05 DIAGNOSIS — E1169 Type 2 diabetes mellitus with other specified complication: Secondary | ICD-10-CM | POA: Diagnosis not present

## 2018-06-13 NOTE — Progress Notes (Signed)
Cardiology Office Note:    Date:  06/14/2018   ID:  Felicia Guzman, DOB 08/13/1920, MRN [id number]  PCP:  Melony Overly, MD  Cardiologist:  Shirlee More, MD    Referring MD: Melony Overly, MD    ASSESSMENT:    1. Chronic diastolic heart failure ([place])   2. Chronic atrial fibrillation (HCC)   3. Hypertensive heart disease with heart failure ([place])    PLAN:    In order of problems listed above:  1. Her heart failure is well compensated she has no fluid overload New York Heart Association class I she will continue sodium restriction her current loop diuretic. 2. Stable rate is controlled with beta-blocker and continue her reduced dose anticoagulant 3. Blood pressure target continue current treatment diuretic and metoprolol 4. Chronic anticoagulation stable no bleeding complication continue reduced dose Eliquis   Next appointment: 6 months   Medication Adjustments/Labs and Tests Ordered: Current medicines are reviewed at length with the patient today.  Concerns regarding medicines are outlined above.  No orders of the defined types were placed in this encounter.  No orders of the defined types were placed in this encounter.   Chief Complaint  Patient presents with  . Follow-up  . Congestive Heart Failure  . Atrial Fibrillation  . Hypertension    History of Present Illness:    Felicia Guzman is a 84 y.o. female with a hx of diastolic heart failure chronic atrial fibrillation not anticoagulated with frequent falls as well as her personal choice hypertensive heart disease with heart failure and stage III CKD.  She was last seen 12/31/2017. 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  She is limited by knee pain but no edema orthopnea chest pain palpitation or syncope.  Recent labs from her PCP office shows cholesterol 152 HDL 47 LDL 86 hemoglobin 13.7 creatinine 1.7 K4.1  she is on her anticoagulant without bleeding complication Past Medical History:  Diagnosis Date  . Anemia, chronic disease 06/06/2017  . Arthritis   . Cataract   . CHF (congestive heart failure) ([place])   . Choledocholithiasis 06/06/2017  . Chronic anticoagulation 05/03/2015  . Chronic atrial fibrillation ([place]) 05/03/2015   Overview:  CHADS2 vasc =6. She refuses an [place]  . Chronic diastolic heart failure ([place]) 05/03/2015  . Chronic kidney disease   . CKD (chronic kidney disease) stage 3, GFR 30-59 ml/min (HCC) 06/06/2017  . Clotting disorder ([place])   . COPD (chronic obstructive pulmonary disease) ([place]) 06/06/2017  . Diabetes mellitus without complication ([place])   . Frequent falls 06/06/2017  . Gout 06/06/2017  . Hyperlipidemia 06/06/2017  . Hypertension   . Hypertensive heart disease with heart failure ([place]) 05/03/2015  . Osteoporosis 06/06/2017  . PE (pulmonary thromboembolism) ([place]) 06/06/2017  . Primary osteoarthritis of right knee 10/25/2015  . Thyroid disease   . Type 2 diabetes, controlled, with neuropathy ([place]) 06/06/2017  . Vitamin D deficiency 06/06/2017    Past Surgical History:  Procedure Laterality Date  . ABDOMINAL HYSTERECTOMY    . APPENDECTOMY    . CHOLECYSTECTOMY    . ERCP    . EYE SURGERY    . TOTAL KNEE ARTHROPLASTY      Current Medications: Current Meds  Medication Sig  . Apremilast 30 MG TABS Take 1 tablet by mouth daily.  . calcitRIOL (ROCALTROL) 0.25 MCG capsule Take 0.25 mcg by mouth daily. Takes on Mon, Wed,  and Friday  . dorzolamide-timolol (COSOPT) 22.3-6.8 MG/ML ophthalmic solution 1 drop Two (2) times a day.  Marland Kitchen ELIQUIS 2.5 MG TABS tablet Take 2.5 mg by mouth 2 (two) times daily.  . furosemide (LASIX) 40 MG tablet Take 1 tablet (40 mg total) by mouth 2 (two) times daily. Take 1 extra tablet every morning on Monday, Wednesday, Friday.  . Lancets MISC by Does not apply route.  Marland Kitchen levothyroxine (SYNTHROID, LEVOTHROID) 88 MCG tablet Take 88 mcg by mouth daily before  breakfast.  . metoprolol tartrate (LOPRESSOR) 50 MG tablet Take 50 mg by mouth 2 (two) times daily.   . ranitidine (ZANTAC) 150 MG tablet take 1 tablet by mouth every 12 hours if needed for INDIGESTION  . ULORIC 40 MG tablet TK 1 T PO QD     Allergies:   Buprenorphine hcl and Morphine and related   Social History   Socioeconomic History  . Marital status: Widowed    Spouse name: Not on file  . Number of children: Not on file  . Years of education: Not on file  . Highest education level: Not on file  Occupational History  . Not on file  Social Needs  . Financial resource strain: Not on file  . Food insecurity:    Worry: Not on file    Inability: Not on file  . Transportation needs:    Medical: Not on file    Non-medical: Not on file  Tobacco Use  . Smoking status: Never Smoker  . Smokeless tobacco: Never Used  Substance and Sexual Activity  . Alcohol use: No  . Drug use: No  . Sexual activity: Not on file  Lifestyle  . Physical activity:    Days per week: Not on file    Minutes per session: Not on file  . Stress: Not on file  Relationships  . Social connections:    Talks on phone: Not on file    Gets together: Not on file    Attends religious service: Not on file    Active member of club or organization: Not on file    Attends meetings of clubs or organizations: Not on file    Relationship status: Not on file  Other Topics Concern  . Not on file  Social History Narrative  . Not on file     Family History: The patient's family history includes Heart attack in her father; Hypertension in her father; Stroke in her father. ROS:   Please see the history of present illness.    All other systems reviewed and are negative.  EKGs/Labs/Other Studies Reviewed:    The following studies were reviewed today:    Physical Exam:    VS:  BP 118/82 (BP Location: Left Arm, Patient Position: Sitting, Cuff Size: Normal)   Pulse 92   Ht 4\' 11"  (1.499 m)   Wt 138 lb (62.6  kg)   SpO2 97%   BMI 27.87 kg/m     Wt Readings from Last 3 Encounters:  06/14/18 138 lb (62.6 kg)  12/31/17 141 lb (64 kg)  07/27/17 150 lb (68 kg)     GEN:  Well nourished, well developed in no acute distress HEENT: Normal NECK: No JVD; No carotid bruits LYMPHATICS: No lymphadenopathy CARDIAC: RRR, no murmurs, rubs, gallops RESPIRATORY:  Clear to auscultation without rales, wheezing or rhonchi  ABDOMEN: Soft, non-tender, non-distended MUSCULOSKELETAL:  No edema; No deformity  SKIN: Warm and dry NEUROLOGIC:  Alert and oriented x 3 PSYCHIATRIC:  Normal  affect    Signed, Shirlee More, MD  06/14/2018 11:14 AM    [place]

## 2018-06-14 ENCOUNTER — Ambulatory Visit (INDEPENDENT_AMBULATORY_CARE_PROVIDER_SITE_OTHER): Payer: Medicare Other | Admitting: Cardiology

## 2018-06-14 VITALS — BP 118/82 | HR 92 | Ht 59.0 in | Wt 138.0 lb

## 2018-06-14 DIAGNOSIS — I5032 Chronic diastolic (congestive) heart failure: Secondary | ICD-10-CM

## 2018-06-14 DIAGNOSIS — I482 Chronic atrial fibrillation, unspecified: Secondary | ICD-10-CM

## 2018-06-14 DIAGNOSIS — I110 Hypertensive heart disease with heart failure: Secondary | ICD-10-CM

## 2018-06-14 NOTE — Patient Instructions (Signed)

## 2018-07-02 DIAGNOSIS — Z0000 Encounter for general adult medical examination without abnormal findings: Secondary | ICD-10-CM | POA: Diagnosis not present

## 2018-07-02 DIAGNOSIS — E785 Hyperlipidemia, unspecified: Secondary | ICD-10-CM | POA: Diagnosis not present

## 2018-07-02 DIAGNOSIS — Z9181 History of falling: Secondary | ICD-10-CM | POA: Diagnosis not present

## 2018-07-02 DIAGNOSIS — Z139 Encounter for screening, unspecified: Secondary | ICD-10-CM | POA: Diagnosis not present

## 2018-07-03 DIAGNOSIS — H401133 Primary open-angle glaucoma, bilateral, severe stage: Secondary | ICD-10-CM | POA: Diagnosis not present

## 2018-07-03 DIAGNOSIS — H34812 Central retinal vein occlusion, left eye, with macular edema: Secondary | ICD-10-CM | POA: Diagnosis not present

## 2018-07-03 DIAGNOSIS — H35033 Hypertensive retinopathy, bilateral: Secondary | ICD-10-CM | POA: Diagnosis not present

## 2018-08-11 DIAGNOSIS — M25422 Effusion, left elbow: Secondary | ICD-10-CM | POA: Diagnosis not present

## 2018-09-26 DIAGNOSIS — L400 Psoriasis vulgaris: Secondary | ICD-10-CM | POA: Diagnosis not present

## 2018-12-05 DIAGNOSIS — M109 Gout, unspecified: Secondary | ICD-10-CM | POA: Diagnosis not present

## 2018-12-05 DIAGNOSIS — Z86718 Personal history of other venous thrombosis and embolism: Secondary | ICD-10-CM | POA: Diagnosis not present

## 2018-12-05 DIAGNOSIS — I509 Heart failure, unspecified: Secondary | ICD-10-CM | POA: Diagnosis not present

## 2018-12-05 DIAGNOSIS — E1122 Type 2 diabetes mellitus with diabetic chronic kidney disease: Secondary | ICD-10-CM | POA: Diagnosis not present

## 2018-12-05 DIAGNOSIS — E1169 Type 2 diabetes mellitus with other specified complication: Secondary | ICD-10-CM | POA: Diagnosis not present

## 2018-12-16 ENCOUNTER — Other Ambulatory Visit: Payer: Self-pay

## 2018-12-16 MED ORDER — FUROSEMIDE 40 MG PO TABS: 40.0000 mg | ORAL_TABLET | Freq: Two times a day (BID) | ORAL | 3 refills | Status: DC

## 2018-12-17 ENCOUNTER — Ambulatory Visit: Payer: Medicare Other | Admitting: Cardiology

## 2018-12-19 ENCOUNTER — Ambulatory Visit: Payer: Medicare Other | Admitting: Cardiology

## 2018-12-24 ENCOUNTER — Telehealth: Payer: Self-pay | Admitting: Cardiology

## 2018-12-24 NOTE — Telephone Encounter (Signed)
Cardiac Questionnaire:    Since your last visit or hospitalization:    1. Have you been having new or worsening chest pain? no   2. Have you been having new or worsening shortness of breath?no 3. Have you been having new or worsening leg swelling, wt gain, or increase in abdominal girth (pants fitting more tightly)? no   4. Have you had any passing out spells? no    *A YES to any of these questions would result in the appointment being kept. *If all the answers to these questions are NO, we should indicate that given the current situation regarding the worldwide coronarvirus pandemic, at the recommendation of the CDC, we are looking to limit gatherings in our waiting area, and thus will reschedule their appointment beyond four weeks from today.   _____________   [id number] Pre-Screening Questions:   Do you currently have a fever? no  Have you recently travelled on a cruise, internationally, or to [place], [place], [place], or [place], [place]) ? no  Have you been in contact with someone that is currently pending confirmation of Covid19 testing or has been confirmed to have the [place] virus?  no  Are you currently experiencing fatigue or cough? No  YOUR CARDIOLOGY TEAM HAS ARRANGED FOR AN E-VISIT FOR YOUR APPOINTMENT - PLEASE REVIEW IMPORTANT INFORMATION BELOW SEVERAL DAYS PRIOR TO YOUR APPOINTMENT  Due to the recent COVID-19 pandemic, we are transitioning in-person office visits to tele-medicine visits in an effort to decrease unnecessary exposure to our patients and staff. Medicare and most insurances are covering these visits without a copay needed. We also encourage you to sign up for MyChart if you have not already done so. You will need a smartphone if possible. For patients that do not have this, we can still complete the visit using a regular telephone but do prefer a smartphone to enable video when possible. You may have a close family member that lives with you that can  help. If possible, we also ask that you have a blood pressure cuff and scale at home to measure your blood pressure, heart rate and weight prior to your scheduled appointment. Patients with clinical needs that need an in-person evaluation and testing will still be able to come to the office if absolutely necessary. If you have any questions, feel free to call our office.   CONSENT FOR TELE-HEALTH VISIT - PLEASE REVIEW  I hereby voluntarily request, consent and authorize [facility] and its employed or contracted physicians, physician assistants, nurse practitioners or other licensed health care professionals (the Practitioner), to provide me with telemedicine health care services (the Services") as deemed necessary by the treating Practitioner. I acknowledge and consent to receive the Services by the Practitioner via telemedicine. I understand that the telemedicine visit will involve communicating with the Practitioner through live audiovisual communication technology and the disclosure of certain medical information by electronic transmission. I acknowledge that I have been given the opportunity to request an in-person assessment or other available alternative prior to the telemedicine visit and am voluntarily participating in the telemedicine visit.  I understand that I have the right to withhold or withdraw my consent to the use of telemedicine in the course of my care at any time, without affecting my right to future care or treatment, and that the Practitioner or I may terminate the telemedicine visit at any time. I understand that I have the right to inspect all information obtained and/or recorded in the course of  the telemedicine visit and may receive copies of available information for a reasonable fee.  I understand that some of the potential risks of receiving the Services via telemedicine include:   Delay or interruption in medical evaluation due to technological equipment failure or  disruption;  Information transmitted may not be sufficient (e.g. poor resolution of images) to allow for appropriate medical decision making by the Practitioner; and/or   In rare instances, security protocols could fail, causing a breach of personal health information.  Furthermore, I acknowledge that it is my responsibility to provide information about my medical history, conditions and care that is complete and accurate to the best of my ability. I acknowledge that Practitioner's advice, recommendations, and/or decision may be based on factors not within their control, such as incomplete or inaccurate data provided by me or distortions of diagnostic images or specimens that may result from electronic transmissions. I understand that the practice of medicine is not an exact science and that Practitioner makes no warranties or guarantees regarding treatment outcomes. I acknowledge that I will receive a copy of this consent concurrently upon execution via email to the email address I last provided but may also request a printed copy by calling the office of [facility].    I understand that my insurance will be billed for this visit.   I have read or had this consent read to me.  I understand the contents of this consent, which adequately explains the benefits and risks of the Services being provided via telemedicine.   I have been provided ample opportunity to ask questions regarding this consent and the Services and have had my questions answered to my satisfaction.  I give my informed consent for the services to be provided through the use of telemedicine in my medical care  By participating in this telemedicine visit I agree to the above.  Patient gives consent to virtual televisit 12/24/2018 pp

## 2018-12-27 ENCOUNTER — Telehealth (INDEPENDENT_AMBULATORY_CARE_PROVIDER_SITE_OTHER): Payer: Medicare Other | Admitting: Cardiology

## 2018-12-27 ENCOUNTER — Encounter: Payer: Self-pay | Admitting: Cardiology

## 2018-12-27 VITALS — BP 94/52 | HR 70 | Ht 59.0 in

## 2018-12-27 DIAGNOSIS — I5032 Chronic diastolic (congestive) heart failure: Secondary | ICD-10-CM | POA: Diagnosis not present

## 2018-12-27 DIAGNOSIS — N183 Chronic kidney disease, stage 3 unspecified: Secondary | ICD-10-CM

## 2018-12-27 DIAGNOSIS — I110 Hypertensive heart disease with heart failure: Secondary | ICD-10-CM

## 2018-12-27 DIAGNOSIS — I482 Chronic atrial fibrillation, unspecified: Secondary | ICD-10-CM

## 2018-12-27 DIAGNOSIS — Z7901 Long term (current) use of anticoagulants: Secondary | ICD-10-CM

## 2018-12-27 MED ORDER — FAMOTIDINE 20 MG PO TABS: 20.0000 mg | ORAL_TABLET | Freq: Every day | ORAL | 2 refills | Status: DC

## 2018-12-27 MED ORDER — METOPROLOL TARTRATE 25 MG PO TABS: 25.0000 mg | ORAL_TABLET | Freq: Every day | ORAL | 2 refills | Status: AC

## 2018-12-27 NOTE — Progress Notes (Signed)
Virtual Visit via Video Note   This visit type was conducted due to national recommendations for restrictions regarding the COVID-19 Pandemic (e.g. social distancing) in an effort to limit this patient's exposure and mitigate transmission in our community.  Due to her co-morbid illnesses, this patient is at least at moderate risk for complications without adequate follow up.  This format is felt to be most appropriate for this patient at this time.  All issues noted in this document were discussed and addressed.  A limited physical exam was performed with this format.  Please refer to the patient's chart for her consent to telehealth for [facility].   Evaluation Performed:  Follow-up visit  Date:  12/27/2018   ID:  Felicia Guzman, DOB [birth year], MRN [id number]  Patient Location: Home  Provider Location: Home  PCP:  Melony Overly, MD  Cardiologist:  No primary care provider on file. [facility] Electrophysiologist:  None   Chief Complaint:  FU for heart failure and atrial fibrillation  History of Present Illness:    Felicia Guzman is a 85 y.o. female who presents via audio/video conferencing for a telehealth visit today.    Felicia Guzman is a 85 y.o. female with a hx of diastolic heart failure chronic atrial fibrillation not anticoagulated with frequent falls as well as her personal choice hypertensive heart disease with heart failure and stage III CKD.  She was last seen 06/15/19 at that visit her heart failure was compensators she was continued on her loop diuretic blood pressure was at target and she remained anti-coagulated with chronic atrial fibrillation  The patient does not have symptoms concerning for COVID-19 infection (fever, chills, cough, or new shortness of breath).   She is very fortunate to have an attending granddaughter who is staying at the home taking care of her father who had total knee replacement assisting with the mother who has dementia and is now taking care  of the grandmother.  She is doing well her weight is stable at home blood pressure though has been less than 625 systolic and will further decrease the dose of her beta-blocker.  They will continue to monitor and contact me if she remains less than 100.  She has had no edema orthopnea shortness of breath chest pain palpitation or syncope she is isolated to the home and only family members are present.  I cautioned the granddaughter to be careful with sanitizing her hands and encouraged her to wear a mask when she is in the home taking care of her grandmother Past Medical History:  Diagnosis Date  . Anemia, chronic disease 06/06/2017  . Arthritis   . Cataract   . CHF (congestive heart failure) ([place])   . Choledocholithiasis 06/06/2017  . Chronic anticoagulation 05/03/2015  . Chronic atrial fibrillation 05/03/2015   Overview:  CHADS2 vasc =6. She refuses an [place]  . Chronic diastolic heart failure ([place]) 05/03/2015  . Chronic kidney disease   . CKD (chronic kidney disease) stage 3, GFR 30-59 ml/min (HCC) 06/06/2017  . Clotting disorder ([place])   . COPD (chronic obstructive pulmonary disease) ([place]) 06/06/2017  . Diabetes mellitus without complication ([place])   . Frequent falls 06/06/2017  . Gout 06/06/2017  . Hyperlipidemia 06/06/2017  . Hypertension   . Hypertensive heart disease with heart failure ([place]) 05/03/2015  . Osteoporosis 06/06/2017  . PE (pulmonary thromboembolism) ([place]) 06/06/2017  . Primary osteoarthritis of right knee 10/25/2015  . Thyroid disease   . Type 2 diabetes, controlled, with  neuropathy ([place]) 06/06/2017  . Vitamin D deficiency 06/06/2017   Past Surgical History:  Procedure Laterality Date  . ABDOMINAL HYSTERECTOMY    . APPENDECTOMY    . CHOLECYSTECTOMY    . ERCP    . EYE SURGERY    . TOTAL KNEE ARTHROPLASTY       Current Meds  Medication Sig  . Apremilast 30 MG TABS Take 1 tablet by mouth daily.  . calcitRIOL (ROCALTROL) 0.25 MCG capsule Take 0.25 mcg by mouth daily. Takes on  Mon, Wed, and Friday  . dorzolamide-timolol (COSOPT) 22.3-6.8 MG/ML ophthalmic solution 1 drop Two (2) times a day.  Marland Kitchen ELIQUIS 2.5 MG TABS tablet Take 2.5 mg by mouth 2 (two) times daily.  . furosemide (LASIX) 40 MG tablet Take 1 tablet (40 mg total) by mouth 2 (two) times daily. Take 1 extra tablet every morning on Monday, Wednesday, Friday.  . Lancets MISC by Does not apply route.  Marland Kitchen levothyroxine (SYNTHROID, LEVOTHROID) 75 MCG tablet Take 75 mcg by mouth daily before breakfast.   . metoprolol tartrate (LOPRESSOR) 25 MG tablet Take 25 mg by mouth 2 (two) times daily.   . montelukast (SINGULAIR) 10 MG tablet TK 1 T PO D  . ranitidine (ZANTAC) 150 MG tablet take 1 tablet by mouth every 12 hours if needed for INDIGESTION  . ULORIC 40 MG tablet TK 1 T PO QD     Allergies:   Buprenorphine hcl and Morphine and related   Social History   Tobacco Use  . Smoking status: Never Smoker  . Smokeless tobacco: Never Used  Substance Use Topics  . Alcohol use: No  . Drug use: No     Family Hx: The patient's family history includes Heart attack in her father; Hypertension in her father; Stroke in her father.  ROS:   Please see the history of present illness.     All other systems reviewed and are negative.   Prior CV studies:   The following studies were reviewed today:    Labs/Other Tests and Data Reviewed:      Recent Labs:   Latest lab work 06/05/2018 cholesterol 152 HDL 47 LDL 86 creatinine 1.52 GFR 25 cc potassium 4.1 hemoglobin 13.7 No results found for requested labs within last 8760 hours.   Recent Lipid Panel No results found for: CHOL, TRIG, HDL, CHOLHDL, LDLCALC, LDLDIRECT  Wt Readings from Last 3 Encounters:  06/14/18 138 lb (62.6 kg)  12/31/17 141 lb (64 kg)  07/27/17 150 lb (68 kg)     Objective:    Vital Signs:  BP (!) 94/52 (BP Location: Left Arm, Patient Position: Sitting)   Pulse 70   Ht 4\' 11"  (1.499 m)   BMI 27.87 kg/m    Constitutional,  well-nourished well-developed in no acute distress she is hard of hearing her granddaughter assisted she does not look chronically ill or debilitated Vital signs reviewed Eyes, conjunctiva and sclera are normal without pallor or icterus extraocular motions intact and normal there is no lid lag Respiratory, normal effort and excursion no audible wheezing without a stethoscope Cardiovascular, no neck vein distention or peripheral edema Skin, no rash skin lesion or ulceration of the extremities Neurologic, cranial nerves II to XII are grossly intact and the patient moves all 4 extremities Neuro/Psychiatric, judgment and thought processes are intact and coherent, alert and oriented x3, mood and affect appear normal.  ASSESSMENT & PLAN:    1. Heart failure chronic diastolic nicely compensated continue her current dose of  loop diuretic and I think it safe to wait to June to check lab work including renal function proBNP and CBC on anticoagulant.  The family has controlled dietary sodium and should be seen daily 2. Worsen blood pressure less than 100 decrease beta-blocker to once daily and if it remains less than 100 will need to discontinue entirely 3. Stable rate controlled reduce beta-blocker and continue reduced dose anticoagulant 4. Continue her current anticoagulant she is on a reduced dose with age renal function will check a CBC with labs in June but no bleeding noted by the family 5. Stage IV CKD recheck labs in June I think at this point time the risk outweighs the benefit of bringing this woman outside of her home for phlebotomy.  If she develops symptomatic COVID-19 and pneumonia her mortality would approach 100%  COVID-19 Education: The signs and symptoms of COVID-19 were discussed with the patient and how to seek care for testing (follow up with PCP or arrange E-visit).  The importance of social distancing was discussed today.  Time:   Today, I have spent 35 minutes with the patient with  telehealth technology discussing the above problems.  The granddaughter is present she care coordinates place medications and pillbox and comes and supervises the grandmother and will continue to follow her heart rate blood pressure weights at home and contact us if she has any clinical deterioration   Medication Adjustments/Labs and Tests Ordered: Current medicines are reviewed at length with the patient today.  Concerns regarding medicines are outlined above.  Tests Ordered: No orders of the defined types were placed in this encounter.  Medication Changes: No orders of the defined types were placed in this encounter.   Disposition:  Follow up August 2020  SignedShirlee More, MD  12/27/2018 11:33 AM    [place]

## 2018-12-27 NOTE — Patient Instructions (Signed)
Medication Instructions:  Your physician has recommended you make the following change in your medication:   REDUCE: metoprolol 25mg  to once daily STOP: ranitidine (Zantac)  START: famotidine (Pepcid) 20mg  once daily If you need a refill on your cardiac medications before your next appointment, please call your pharmacy.   Lab work: You will have lab work the first week in June per Dr Bettina Gavia: CBC, BMP,  and ProBNP If you have labs (blood work) drawn today and your tests are completely normal, you will receive your results only by: Marland Kitchen MyChart Message (if you have MyChart) OR . A paper copy in the mail If you have any lab test that is abnormal or we need to change your treatment, we will call you to review the results.  Testing/Procedures: NONE  Follow-Up: At [facility], you and your health needs are our priority.  As part of our continuing mission to provide you with exceptional heart care, we have created designated Provider Care Teams.  These Care Teams include your primary Cardiologist (physician) and Advanced Practice Providers (APPs -  Physician Assistants and Nurse Practitioners) who all work together to provide you with the care you need, when you need it. You will need a follow up appointment in 4 months on May 13, 2019 at 4:00pm.  Please arrive at 3:45 pm

## 2018-12-30 ENCOUNTER — Telehealth: Payer: Medicare Other | Admitting: Cardiology

## 2019-02-06 DIAGNOSIS — I130 Hypertensive heart and chronic kidney disease with heart failure and stage 1 through stage 4 chronic kidney disease, or unspecified chronic kidney disease: Secondary | ICD-10-CM | POA: Diagnosis not present

## 2019-02-06 DIAGNOSIS — R1310 Dysphagia, unspecified: Secondary | ICD-10-CM | POA: Diagnosis not present

## 2019-02-15 DIAGNOSIS — R109 Unspecified abdominal pain: Secondary | ICD-10-CM | POA: Diagnosis not present

## 2019-02-15 DIAGNOSIS — K5730 Diverticulosis of large intestine without perforation or abscess without bleeding: Secondary | ICD-10-CM | POA: Diagnosis not present

## 2019-02-15 DIAGNOSIS — K8309 Other cholangitis: Secondary | ICD-10-CM | POA: Diagnosis not present

## 2019-02-15 DIAGNOSIS — Z03818 Encounter for observation for suspected exposure to other biological agents ruled out: Secondary | ICD-10-CM | POA: Diagnosis not present

## 2019-02-16 ENCOUNTER — Inpatient Hospital Stay (HOSPITAL_COMMUNITY): Payer: Medicare Other

## 2019-02-16 ENCOUNTER — Inpatient Hospital Stay (HOSPITAL_COMMUNITY): Payer: Medicare Other | Admitting: Anesthesiology

## 2019-02-16 ENCOUNTER — Encounter (HOSPITAL_COMMUNITY): Payer: Self-pay | Admitting: Internal Medicine

## 2019-02-16 ENCOUNTER — Observation Stay (HOSPITAL_COMMUNITY)
Admission: AD | Admit: 2019-02-16 | Discharge: 2019-02-17 | Disposition: A | Discharge status: Home / Self Care | Payer: Medicare Other | Source: Other Acute Inpatient Hospital | Attending: Internal Medicine | Admitting: Internal Medicine

## 2019-02-16 ENCOUNTER — Encounter (HOSPITAL_COMMUNITY)
Admission: AD | Disposition: A | Discharge status: Home / Self Care | Payer: Self-pay | Source: Other Acute Inpatient Hospital | Attending: Internal Medicine

## 2019-02-16 DIAGNOSIS — D631 Anemia in chronic kidney disease: Secondary | ICD-10-CM | POA: Insufficient documentation

## 2019-02-16 DIAGNOSIS — M109 Gout, unspecified: Secondary | ICD-10-CM | POA: Diagnosis not present

## 2019-02-16 DIAGNOSIS — I482 Chronic atrial fibrillation, unspecified: Secondary | ICD-10-CM | POA: Diagnosis not present

## 2019-02-16 DIAGNOSIS — M1990 Unspecified osteoarthritis, unspecified site: Secondary | ICD-10-CM | POA: Diagnosis not present

## 2019-02-16 DIAGNOSIS — Z9049 Acquired absence of other specified parts of digestive tract: Secondary | ICD-10-CM | POA: Diagnosis not present

## 2019-02-16 DIAGNOSIS — R1011 Right upper quadrant pain: Secondary | ICD-10-CM | POA: Diagnosis not present

## 2019-02-16 DIAGNOSIS — E1122 Type 2 diabetes mellitus with diabetic chronic kidney disease: Secondary | ICD-10-CM | POA: Insufficient documentation

## 2019-02-16 DIAGNOSIS — R1013 Epigastric pain: Secondary | ICD-10-CM | POA: Diagnosis not present

## 2019-02-16 DIAGNOSIS — I5032 Chronic diastolic (congestive) heart failure: Secondary | ICD-10-CM | POA: Diagnosis not present

## 2019-02-16 DIAGNOSIS — N183 Chronic kidney disease, stage 3 unspecified: Secondary | ICD-10-CM | POA: Diagnosis present

## 2019-02-16 DIAGNOSIS — R1010 Upper abdominal pain, unspecified: Secondary | ICD-10-CM

## 2019-02-16 DIAGNOSIS — K219 Gastro-esophageal reflux disease without esophagitis: Secondary | ICD-10-CM | POA: Insufficient documentation

## 2019-02-16 DIAGNOSIS — K5730 Diverticulosis of large intestine without perforation or abscess without bleeding: Secondary | ICD-10-CM | POA: Diagnosis not present

## 2019-02-16 DIAGNOSIS — Z86711 Personal history of pulmonary embolism: Secondary | ICD-10-CM | POA: Diagnosis not present

## 2019-02-16 DIAGNOSIS — E039 Hypothyroidism, unspecified: Secondary | ICD-10-CM | POA: Diagnosis not present

## 2019-02-16 DIAGNOSIS — Z79899 Other long term (current) drug therapy: Secondary | ICD-10-CM | POA: Diagnosis not present

## 2019-02-16 DIAGNOSIS — K8051 Calculus of bile duct without cholangitis or cholecystitis with obstruction: Secondary | ICD-10-CM

## 2019-02-16 DIAGNOSIS — R7989 Other specified abnormal findings of blood chemistry: Secondary | ICD-10-CM

## 2019-02-16 DIAGNOSIS — I130 Hypertensive heart and chronic kidney disease with heart failure and stage 1 through stage 4 chronic kidney disease, or unspecified chronic kidney disease: Secondary | ICD-10-CM | POA: Insufficient documentation

## 2019-02-16 DIAGNOSIS — Z885 Allergy status to narcotic agent status: Secondary | ICD-10-CM | POA: Diagnosis not present

## 2019-02-16 DIAGNOSIS — K8309 Other cholangitis: Secondary | ICD-10-CM | POA: Diagnosis present

## 2019-02-16 DIAGNOSIS — E1140 Type 2 diabetes mellitus with diabetic neuropathy, unspecified: Secondary | ICD-10-CM | POA: Diagnosis not present

## 2019-02-16 DIAGNOSIS — Z7901 Long term (current) use of anticoagulants: Secondary | ICD-10-CM | POA: Diagnosis not present

## 2019-02-16 DIAGNOSIS — K8030 Calculus of bile duct with cholangitis, unspecified, without obstruction: Secondary | ICD-10-CM | POA: Diagnosis not present

## 2019-02-16 DIAGNOSIS — D649 Anemia, unspecified: Secondary | ICD-10-CM | POA: Diagnosis not present

## 2019-02-16 DIAGNOSIS — R509 Fever, unspecified: Secondary | ICD-10-CM | POA: Diagnosis not present

## 2019-02-16 DIAGNOSIS — Z86718 Personal history of other venous thrombosis and embolism: Secondary | ICD-10-CM | POA: Insufficient documentation

## 2019-02-16 DIAGNOSIS — K8050 Calculus of bile duct without cholangitis or cholecystitis without obstruction: Secondary | ICD-10-CM | POA: Diagnosis present

## 2019-02-16 DIAGNOSIS — J449 Chronic obstructive pulmonary disease, unspecified: Secondary | ICD-10-CM | POA: Diagnosis not present

## 2019-02-16 DIAGNOSIS — I10 Essential (primary) hypertension: Secondary | ICD-10-CM

## 2019-02-16 DIAGNOSIS — D638 Anemia in other chronic diseases classified elsewhere: Secondary | ICD-10-CM | POA: Diagnosis present

## 2019-02-16 DIAGNOSIS — Z79890 Hormone replacement therapy (postmenopausal): Secondary | ICD-10-CM | POA: Diagnosis not present

## 2019-02-16 DIAGNOSIS — R740 Nonspecific elevation of levels of transaminase and lactic acid dehydrogenase [LDH]: Secondary | ICD-10-CM

## 2019-02-16 HISTORY — PX: REMOVAL OF STONES: SHX5545

## 2019-02-16 HISTORY — PX: ERCP: SHX5425

## 2019-02-16 LAB — CBC
HCT: 35.4 % — ABNORMAL LOW (ref 36.0–46.0)
Hemoglobin: 11.6 g/dL — ABNORMAL LOW (ref 12.0–15.0)
MCH: 29.9 pg (ref 26.0–34.0)
MCHC: 32.8 g/dL (ref 30.0–36.0)
MCV: 91.2 fL (ref 80.0–100.0)
Platelets: 165 10*3/uL (ref 150–400)
RBC: 3.88 MIL/uL (ref 3.87–5.11)
RDW: 14 % (ref 11.5–15.5)
WBC: 8.1 10*3/uL (ref 4.0–10.5)
nRBC: 0 % (ref 0.0–0.2)

## 2019-02-16 LAB — COMPREHENSIVE METABOLIC PANEL
ALT: 294 U/L — ABNORMAL HIGH (ref 0–44)
AST: 602 U/L — ABNORMAL HIGH (ref 15–41)
Albumin: 2.7 g/dL — ABNORMAL LOW (ref 3.5–5.0)
Alkaline Phosphatase: 218 U/L — ABNORMAL HIGH (ref 38–126)
Anion gap: 10 (ref 5–15)
BUN: 27 mg/dL — ABNORMAL HIGH (ref 8–23)
CO2: 25 mmol/L (ref 22–32)
Calcium: 8.7 mg/dL — ABNORMAL LOW (ref 8.9–10.3)
Chloride: 103 mmol/L (ref 98–111)
Creatinine, Ser: 2.2 mg/dL — ABNORMAL HIGH (ref 0.44–1.00)
GFR calc Af Amer: 21 mL/min — ABNORMAL LOW (ref >60)
GFR calc non Af Amer: 18 mL/min — ABNORMAL LOW (ref >60)
Glucose, Bld: 118 mg/dL — ABNORMAL HIGH (ref 70–99)
Potassium: 4.1 mmol/L (ref 3.5–5.1)
Sodium: 138 mmol/L (ref 135–145)
Total Bilirubin: 2.4 mg/dL — ABNORMAL HIGH (ref 0.3–1.2)
Total Protein: 5.4 g/dL — ABNORMAL LOW (ref 6.5–8.1)

## 2019-02-16 LAB — LACTIC ACID, PLASMA
Lactic Acid, Venous: 1.4 mmol/L (ref 0.5–1.9)
Lactic Acid, Venous: 1 mmol/L (ref 0.5–1.9)

## 2019-02-16 LAB — MRSA PCR SCREENING: MRSA by PCR: NEGATIVE

## 2019-02-16 LAB — PROCALCITONIN: Procalcitonin: 1.01 ng/mL

## 2019-02-16 SURGERY — ERCP, WITH INTERVENTION IF INDICATED
Anesthesia: General

## 2019-02-16 MED ORDER — APIXABAN 2.5 MG PO TABS
2.5000 mg | ORAL_TABLET | Freq: Two times a day (BID) | ORAL | Status: DC
  Administered 2019-02-16 – 2019-02-17 (×4): 2.5 mg via ORAL
  Filled 2019-02-16 (×4): qty 1

## 2019-02-16 MED ORDER — ACETAMINOPHEN 325 MG PO TABS: 650.0000 mg | ORAL_TABLET | Freq: Four times a day (QID) | ORAL | Status: DC | PRN

## 2019-02-16 MED ORDER — SODIUM CHLORIDE 0.9 % IV SOLN
INTRAVENOUS | Status: DC
  Administered 2019-02-16 (×2): via INTRAVENOUS

## 2019-02-16 MED ORDER — INDOMETHACIN 50 MG RE SUPP
RECTAL | Status: DC | PRN
  Administered 2019-02-16: 100 mg via RECTAL

## 2019-02-16 MED ORDER — INDOMETHACIN 50 MG RE SUPP
RECTAL | Status: AC
  Filled 2019-02-16: qty 1

## 2019-02-16 MED ORDER — ACETAMINOPHEN 650 MG RE SUPP: 650.0000 mg | Freq: Four times a day (QID) | RECTAL | Status: DC | PRN

## 2019-02-16 MED ORDER — PROPOFOL 10 MG/ML IV BOLUS
INTRAVENOUS | Status: DC | PRN
  Administered 2019-02-16: 150 mg via INTRAVENOUS

## 2019-02-16 MED ORDER — ONDANSETRON HCL 4 MG/2ML IJ SOLN: 4.0000 mg | Freq: Once | INTRAMUSCULAR | Status: DC | PRN

## 2019-02-16 MED ORDER — SODIUM CHLORIDE 0.9 % IV SOLN: INTRAVENOUS | Status: DC

## 2019-02-16 MED ORDER — METOPROLOL TARTRATE 25 MG PO TABS
25.0000 mg | ORAL_TABLET | Freq: Every day | ORAL | Status: DC
  Administered 2019-02-16 – 2019-02-17 (×2): 25 mg via ORAL
  Filled 2019-02-16 (×2): qty 1

## 2019-02-16 MED ORDER — SODIUM CHLORIDE 0.9 % IV SOLN
INTRAVENOUS | Status: DC | PRN
  Administered 2019-02-16: 80 ug/min via INTRAVENOUS
  Administered 2019-02-16: 20 ug/min via INTRAVENOUS

## 2019-02-16 MED ORDER — FENTANYL CITRATE (PF) 100 MCG/2ML IJ SOLN: 25.0000 ug | INTRAMUSCULAR | Status: DC | PRN

## 2019-02-16 MED ORDER — DEXAMETHASONE SODIUM PHOSPHATE 10 MG/ML IJ SOLN
INTRAMUSCULAR | Status: DC | PRN
  Administered 2019-02-16: 4 mg via INTRAVENOUS

## 2019-02-16 MED ORDER — ONDANSETRON HCL 4 MG PO TABS: 4.0000 mg | ORAL_TABLET | Freq: Four times a day (QID) | ORAL | Status: DC | PRN

## 2019-02-16 MED ORDER — GLUCAGON HCL RDNA (DIAGNOSTIC) 1 MG IJ SOLR
INTRAMUSCULAR | Status: AC
  Filled 2019-02-16: qty 1

## 2019-02-16 MED ORDER — SUCCINYLCHOLINE CHLORIDE 200 MG/10ML IV SOSY
PREFILLED_SYRINGE | INTRAVENOUS | Status: DC | PRN
  Administered 2019-02-16: 50 mg via INTRAVENOUS

## 2019-02-16 MED ORDER — SODIUM CHLORIDE 0.9 % IV SOLN
INTRAVENOUS | Status: DC | PRN
  Administered 2019-02-16: 50 mL

## 2019-02-16 MED ORDER — PIPERACILLIN-TAZOBACTAM IN DEX 2-0.25 GM/50ML IV SOLN
2.2500 g | Freq: Three times a day (TID) | INTRAVENOUS | Status: DC
  Administered 2019-02-16 – 2019-02-17 (×4): 2.25 g via INTRAVENOUS
  Filled 2019-02-16 (×5): qty 50

## 2019-02-16 MED ORDER — ONDANSETRON HCL 4 MG/2ML IJ SOLN: 4.0000 mg | Freq: Four times a day (QID) | INTRAMUSCULAR | Status: DC | PRN

## 2019-02-16 MED ORDER — LIDOCAINE 2% (20 MG/ML) 5 ML SYRINGE
INTRAMUSCULAR | Status: DC | PRN
  Administered 2019-02-16: 60 mg via INTRAVENOUS

## 2019-02-16 MED ORDER — ONDANSETRON HCL 4 MG/2ML IJ SOLN
INTRAMUSCULAR | Status: DC | PRN
  Administered 2019-02-16: 4 mg via INTRAVENOUS

## 2019-02-16 MED ORDER — PIPERACILLIN-TAZOBACTAM 3.375 G IVPB
3.3750 g | Freq: Once | INTRAVENOUS | Status: AC
  Administered 2019-02-16: 3.375 g via INTRAVENOUS
  Filled 2019-02-16: qty 50

## 2019-02-16 MED ORDER — PHENYLEPHRINE 40 MCG/ML (10ML) SYRINGE FOR IV PUSH (FOR BLOOD PRESSURE SUPPORT)
PREFILLED_SYRINGE | INTRAVENOUS | Status: DC | PRN
  Administered 2019-02-16: 80 ug via INTRAVENOUS
  Administered 2019-02-16: 120 ug via INTRAVENOUS
  Administered 2019-02-16: 40 ug via INTRAVENOUS
  Administered 2019-02-16 (×2): 80 ug via INTRAVENOUS

## 2019-02-16 NOTE — Transfer of Care (Signed)
Immediate Anesthesia Transfer of Care Note  Patient: Felicia Guzman  Procedure(s) Performed: ENDOSCOPIC RETROGRADE CHOLANGIOPANCREATOGRAPHY (ERCP) (N/A ) REMOVAL OF STONES  Patient Location: PACU and Endoscopy Unit  Anesthesia Type:General  Level of Consciousness: awake, drowsy and patient cooperative  Airway & Oxygen Therapy: Patient Spontanous Breathing and Patient connected to nasal cannula oxygen  Post-op Assessment: Report given to RN and Post -op Vital signs reviewed and stable  Post vital signs: Reviewed and stable  Last Vitals:  Vitals Value Taken Time  BP    Temp    Pulse    Resp    SpO2      Last Pain:  Vitals:   02/16/19 1307  TempSrc: Temporal  PainSc: 0-No pain         Complications: No apparent anesthesia complications

## 2019-02-16 NOTE — Consult Note (Addendum)
Referring Provider: No ref. provider found Primary Care Physician:  Melony Overly, MD Primary Gastroenterologist:  Althia Forts   Reason for Consultation: Elevated LFTs, RUQ abdominal pain and fever  HPI: Felicia Guzman is a 85 y.o. female with a past medical history of chronic anemia, CHF, choledocholithiasis status post cholecystectomy, chronic atrial fibrillation, DVT/ PE on  Eliquis, chronic kidney disease, COPD, diabetes mellitus type 2, hypertension, PE.  Past surgical history includes appendectomy, cholecystectomy, hysterectomy and total knee arthroscopy. She developed fever, epigastric and right upper quadrant abdominal pain as reported by the ED physician. She presented to [facility]. She was found to have markedly elevated LFTs.  AST 1600.  ALT 259.  Alk phos 330.  Total bili 2.8. WBC 10.9. Temperature 100.9.  COVID test was negative. She was transferred to [facility] for further evaluation with concerns of a sending cholangitis. Records from [facility] not received at this time.   [facility] ED course: Sodium 138.  Potassium 4.1.  Glucose 118.  BUN 27.  Creatinine 2.20.  Alk phos 218.  Albumin 2.7.  AST 602.  ALT 294.  Total bili 2.4.  Lactic acid 1.4.  WBC 8.1.  Hemoglobin 11.6.  Hematocrit 35.4.  Platelet 165.  History of chronic atrial fibrillation.  CHADS2 vasc= 6. On Eliquis. Currently, patient denies having any abdominal pain, nausea or vomiting. She is alert to name, she knows she is in the hospital but doesn't know which one. She is unable to provide accurate history.    Past Medical History:  Diagnosis Date  . Anemia, chronic disease 06/06/2017  . Arthritis   . Cataract   . CHF (congestive heart failure) ([place])   . Choledocholithiasis 06/06/2017  . Chronic anticoagulation 05/03/2015  . Chronic atrial fibrillation 05/03/2015   Overview:  CHADS2 vasc =6. She refuses an [place]  . Chronic diastolic heart failure ([place]) 05/03/2015  . Chronic kidney disease   . CKD (chronic kidney disease)  stage 3, GFR 30-59 ml/min (HCC) 06/06/2017  . Clotting disorder ([place])   . COPD (chronic obstructive pulmonary disease) ([place]) 06/06/2017  . Diabetes mellitus without complication ([place])   . Frequent falls 06/06/2017  . Gout 06/06/2017  . Hyperlipidemia 06/06/2017  . Hypertension   . Hypertensive heart disease with heart failure ([place]) 05/03/2015  . Osteoporosis 06/06/2017  . PE (pulmonary thromboembolism) ([place]) 06/06/2017  . Primary osteoarthritis of right knee 10/25/2015  . Thyroid disease   . Type 2 diabetes, controlled, with neuropathy ([place]) 06/06/2017  . Vitamin D deficiency 06/06/2017    Past Surgical History:  Procedure Laterality Date  . ABDOMINAL HYSTERECTOMY    . APPENDECTOMY    . CHOLECYSTECTOMY    . ERCP    . EYE SURGERY    . TOTAL KNEE ARTHROPLASTY      Prior to Admission medications   Medication Sig Start Date End Date Taking? Authorizing Provider  Apremilast 30 MG TABS Take 1 tablet by mouth daily.    [provider]  calcitRIOL (ROCALTROL) 0.25 MCG capsule Take 0.25 mcg by mouth daily. Takes on Mon, Wed, and Friday    [provider]  dorzolamide-timolol (COSOPT) 22.3-6.8 MG/ML ophthalmic solution 1 drop Two (2) times a day.    [provider]  ELIQUIS 2.5 MG TABS tablet Take 2.5 mg by mouth 2 (two) times daily. 05/23/18   [provider]  famotidine (PEPCID) 20 MG tablet Take 1 tablet (20 mg total) by mouth daily. 12/27/18   Richardo Priest, MD  furosemide (  LASIX) 40 MG tablet Take 1 tablet (40 mg total) by mouth 2 (two) times daily. Take 1 extra tablet every morning on Monday, Wednesday, Friday. 12/16/18   Richardo Priest, MD  Lancets MISC by Does not apply route.    [provider]  levothyroxine (SYNTHROID, LEVOTHROID) 75 MCG tablet Take 75 mcg by mouth daily before breakfast.     [provider]  metoprolol tartrate (LOPRESSOR) 25 MG tablet Take 1 tablet (25 mg total) by mouth daily. 12/27/18   Richardo Priest, MD   montelukast (SINGULAIR) 10 MG tablet TK 1 T PO D 10/18/18   [provider]  ULORIC 40 MG tablet TK 1 T PO QD 12/27/17   [provider]    Current Facility-Administered Medications  Medication Dose Route Frequency Provider Last Rate Last Dose  . 0.9 %  sodium chloride infusion   Intravenous Continuous Phillips Grout, MD 50 mL/hr at 02/16/19 0330    . acetaminophen (TYLENOL) tablet 650 mg  650 mg Oral Q6H PRN Phillips Grout, MD       Or  . acetaminophen (TYLENOL) suppository 650 mg  650 mg Rectal Q6H PRN Phillips Grout, MD      . apixaban Arne Cleveland) tablet 2.5 mg  2.5 mg Oral BID Derrill Kay A, MD   2.5 mg at 02/16/19 0553  . metoprolol tartrate (LOPRESSOR) tablet 25 mg  25 mg Oral Daily Derrill Kay A, MD      . ondansetron (ZOFRAN) tablet 4 mg  4 mg Oral Q6H PRN Phillips Grout, MD       Or  . ondansetron (ZOFRAN) injection 4 mg  4 mg Intravenous Q6H PRN Derrill Kay A, MD      . piperacillin-tazobactam (ZOSYN) IVPB 2.25 g  2.25 g Intravenous Q8H Derrill Kay A, MD      . piperacillin-tazobactam (ZOSYN) IVPB 3.375 g  3.375 g Intravenous Once Derrill Kay A, MD 12.5 mL/hr at 02/16/19 0552 3.375 g at 02/16/19 0552    Allergies as of 02/15/2019 - Review Complete 12/27/2018  Allergen Reaction Noted  . Buprenorphine hcl  05/04/2015  . Morphine and related  01/14/2014    Family History  Problem Relation Age of Onset  . Stroke Father   . Hypertension Father   . Heart attack Father     Social History   Socioeconomic History  . Marital status: Widowed    Spouse name: Not on file  . Number of children: Not on file  . Years of education: Not on file  . Highest education level: Not on file  Occupational History  . Not on file  Social Needs  . Financial resource strain: Not on file  . Food insecurity:    Worry: Not on file    Inability: Not on file  . Transportation needs:    Medical: Not on file    Non-medical: Not on file  Tobacco Use  . Smoking  status: Never Smoker  . Smokeless tobacco: Never Used  Substance and Sexual Activity  . Alcohol use: No  . Drug use: No  . Sexual activity: Not on file  Lifestyle  . Physical activity:    Days per week: Not on file    Minutes per session: Not on file  . Stress: Not on file  Relationships  . Social connections:    Talks on phone: Not on file    Gets together: Not on file    Attends religious service: Not on  file    Active member of club or organization: Not on file    Attends meetings of clubs or organizations: Not on file    Relationship status: Not on file  . Intimate partner violence:    Fear of current or ex partner: Not on file    Emotionally abused: Not on file    Physically abused: Not on file    Forced sexual activity: Not on file  Other Topics Concern  . Not on file  Social History Narrative  . Not on file    Review of Systems: She denies having any abdominal pain, N/V. See HPI. Patient denies having any complaints. ROS not accurate due to confusion, ? dementia.  Physical Exam: Vital signs in last 24 hours: Temp:  [97.8 F (36.6 C)-98 F (36.7 C)] 97.8 F (36.6 C) (05/31 0538) Pulse Rate:  [58-71] 58 (05/31 0538) Resp:  [17-22] 17 (05/31 0538) BP: (83-114)/(61-68) 83/61 (05/31 0538) SpO2:  [99 %] 99 % (05/31 0538) Last BM Date: 02/16/19 General:   Alert in NAD.  Head:  Normocephalic and atraumatic. Eyes:  Sclera clear, no icterus.Conjunctiva pink. Ears:  HOH. Nose:  No deformity, discharge or lesions. Mouth:  No deformity or lesions.   Neck:  Supple. Lungs: Clear throughout. Heart: S1,S2, irregular rhythm, no murmurs.  Abdomen: Soft, nontender, no masses or organomegaly. + BS x 4 quads. Rectal:  Deferred  Msk:  Symmetrical without gross deformities. . Pulses:  Normal pulses noted. Extremities:  Without clubbing or edema. Neurologic:  Alert and  oriented x 1.  Skin:  Intact without significant lesions or rashes.. Psych:  Alert and cooperative. Normal  mood and affect.  Intake/Output from previous day: No intake/output data recorded. Intake/Output this shift: No intake/output data recorded.  Lab Results: Recent Labs    02/16/19 0243  WBC 8.1  HGB 11.6*  HCT 35.4*  PLT 165   BMET Recent Labs    02/16/19 0243  NA 138  K 4.1  CL 103  CO2 25  GLUCOSE 118*  BUN 27*  CREATININE 2.20*  CALCIUM 8.7*   LFT Recent Labs    02/16/19 0243  PROT 5.4*  ALBUMIN 2.7*  AST 602*  ALT 294*  ALKPHOS 218*  BILITOT 2.4*      IMPRESSION/PLAN:  1. 85 y.o female with epigastric pain, RUQ pain fever  100.9 with elevated T. Bili and LFTs concerning for choledocholithiasis with ascending cholangitis. Prior hx of choledocholithiasis, ERCP, s/p cholecystectomy. WBC 8.1. T. Bili 2.4 down from 2.8. Alk phos 218 down from. AST 602. ALT 294. She is currently afebrile. -stat abdominal sonogram -may need MRCP -agree with Zosyn 3.311m IV Q 8 hrs -NPO -Zofran 434mIV Q 6 hrs as needed -add Lipase to prior lab collection -IVF per hospitalist  2. Atrial fibrillation. Eliquis 2.10m58mo today at 8:15am. Hold further Eliquis, may need ERCP.  3. CKD. Cr. 2.20  Further recommendations per Dr. JohScarlette Shorts ColNoralyn Pick/31/2020, 8:11 AM  GI ATTENDING  History, laboratories, x-rays reviewed.  Patient seen and examined.  Agree with comprehensive consultation note as outlined above.  [age]ar old patient with a remote cholecystectomy and subsequent symptomatic choledocholithiasis for which she underwent ERCP with stone extraction elsewhere.  She presents now with right upper quadrant pain, fever, and markedly elevated liver tests.  Symptoms reminiscent of prior symptomatic choledocholithiasis per her granddaughter.  Transferred to this facility overnight because no GI specialty care available at the outside facility.  She  was placed on antibiotics.  She has done better overnight.  Less pain.  Liver tests remain abnormal but have  improved.  Recently performed ultrasound does not show biliary ductal dilation or obvious choledocholithiasis.  I suspect that she has/had choledocholithiasis with associated acute cholangitis.  She did receive a dose of Eliquis earlier today.  Plan is for ERCP and common duct stone extraction and/or biliary stent placement to assure biliary drainage and reduce the risk of recurrent acute cholangitis and its associated morbidity/mortality.  I have reviewed this in detail with the patient's granddaughter [name] Graylon Good who is the patient's healthcare power of attorney [phone]).  We discussed all the pertinent issues of the patient's age, and anticoagulation status.  We also discussed the risks of recurrent acute cholangitis in a 85 year old.The nature of the procedure, as well as the risks (bleeding, perforation, pancreatitis), benefits, and alternatives were carefully and thoroughly reviewed with the patient. Ample time for discussion and questions allowed. The patient understood, was satisfied, and agreed to proceed.  Docia Chuck. Geri Seminole., M.D. [facility] Division of Gastroenterology

## 2019-02-16 NOTE — Anesthesia Postprocedure Evaluation (Signed)
Anesthesia Post Note  Patient: Felicia Guzman  Procedure(s) Performed: ENDOSCOPIC RETROGRADE CHOLANGIOPANCREATOGRAPHY (ERCP) (N/A ) REMOVAL OF STONES     Patient location during evaluation: PACU Anesthesia Type: General Level of consciousness: awake and alert Pain management: pain level controlled Vital Signs Assessment: post-procedure vital signs reviewed and stable Respiratory status: spontaneous breathing, nonlabored ventilation, respiratory function stable and patient connected to nasal cannula oxygen Cardiovascular status: blood pressure returned to baseline and stable Postop Assessment: no apparent nausea or vomiting Anesthetic complications: no    Last Vitals:  Vitals:   02/16/19 1433 02/16/19 1445  BP: 105/72 100/72  Pulse:  (!) 101  Resp: 17 19  Temp: 36.5 C   SpO2: 100% 96%    Last Pain:  Vitals:   02/16/19 1445  TempSrc:   PainSc: 0-No pain                 Ryan P Ellender

## 2019-02-16 NOTE — Anesthesia Preprocedure Evaluation (Addendum)
Anesthesia Evaluation  Patient identified by MRN, date of birth, ID band Patient awake    Reviewed: Allergy & Precautions, NPO status , Patient's Chart, lab work & pertinent test results, reviewed documented beta blocker date and time   Airway Mallampati: II  TM Distance: >3 FB Neck ROM: Full    Dental  (+) Edentulous Upper, Edentulous Lower   Pulmonary COPD, PE   Pulmonary exam normal breath sounds clear to auscultation       Cardiovascular hypertension, Pt. on home beta blockers +CHF  Normal cardiovascular exam+ dysrhythmias Atrial Fibrillation  Rhythm:Regular Rate:Normal     Neuro/Psych negative neurological ROS  negative psych ROS   GI/Hepatic Neg liver ROS, GERD  ,  Endo/Other  diabetesHypothyroidism   Renal/GU CRFRenal disease     Musculoskeletal Gout   Abdominal   Peds  Hematology  (+) anemia ,   Anesthesia Other Findings elevated LFTS, possible choledocholithiais,   past hx choledocholithiasis, past cholecystectomy  Reproductive/Obstetrics                            Anesthesia Physical Anesthesia Plan  ASA: III  Anesthesia Plan: General   Post-op Pain Management:    Induction: Intravenous  PONV Risk Score and Plan: 4 or greater and Dexamethasone, Ondansetron and Treatment may vary due to age or medical condition  Airway Management Planned: Oral ETT  Additional Equipment:   Intra-op Plan:   Post-operative Plan: Extubation in OR  Informed Consent: I have reviewed the patients History and Physical, chart, labs and discussed the procedure including the risks, benefits and alternatives for the proposed anesthesia with the patient or authorized representative who has indicated his/her understanding and acceptance.     Dental advisory given  Plan Discussed with: CRNA  Anesthesia Plan Comments:         Anesthesia Quick Evaluation

## 2019-02-16 NOTE — Progress Notes (Signed)
Patient seen and examined. Admitted after midnight with concerns for ascending cholangitis, patient with elevated AST (1600), ALT (529), Alk Phos (330) and total Bili 2.8; temp was 100.9 and patient was complaining of abd pain. CT scan of abdomen w/o acute intraabdominal abnormalities. Patient with soft, BP, otherwise hemodynamically intact. GI has been consulted due to elevated LFT's. Please refer to H&P written by Dr. Shanon Brow for further info/details on admission.  Plan: -continue IV antibiotics -follow LFT's trend -continue holding diuretics and provide gentle judicious re-hydration -follow GI service rec's   Barton Dubois MD [phone]

## 2019-02-16 NOTE — H&P (Signed)
History and Physical    NYEMAH WATTON NFA:[id number] DOB: 02-19-1920 DOA: 02/16/2019  PCP: Melony Overly, MD  Patient coming from: Home  Chief Complaint: Abdominal pain  HPI: GULIANA WEYANDT is a 85 y.o. female with medical history significant of acute onset of mid epigastric abdominal pain associated with fever that resolved prior to being transferred here patient found to have markedly elevated LFTs with AST of 1600 ALT of 529 alk phos 330 and a total bili of 2.8 temperature of 100.9 patient extremely hard of hearing COVID test is negative white count 10.9.  Patient is being referred for transfer secondary to concerns for ascending cholangitis.  Currently she is laying in bed resting comfortably with no complaints.  Review of Systems: As per HPI otherwise 10 point review of systems negative.   Past Medical History:  Diagnosis Date  . Anemia, chronic disease 06/06/2017  . Arthritis   . Cataract   . CHF (congestive heart failure) ([place])   . Choledocholithiasis 06/06/2017  . Chronic anticoagulation 05/03/2015  . Chronic atrial fibrillation 05/03/2015   Overview:  CHADS2 vasc =6. She refuses an [place]  . Chronic diastolic heart failure ([place]) 05/03/2015  . Chronic kidney disease   . CKD (chronic kidney disease) stage 3, GFR 30-59 ml/min (HCC) 06/06/2017  . Clotting disorder ([place])   . COPD (chronic obstructive pulmonary disease) ([place]) 06/06/2017  . Diabetes mellitus without complication ([place])   . Frequent falls 06/06/2017  . Gout 06/06/2017  . Hyperlipidemia 06/06/2017  . Hypertension   . Hypertensive heart disease with heart failure ([place]) 05/03/2015  . Osteoporosis 06/06/2017  . PE (pulmonary thromboembolism) ([place]) 06/06/2017  . Primary osteoarthritis of right knee 10/25/2015  . Thyroid disease   . Type 2 diabetes, controlled, with neuropathy ([place]) 06/06/2017  . Vitamin D deficiency 06/06/2017    Past Surgical History:  Procedure Laterality Date  . ABDOMINAL HYSTERECTOMY    . APPENDECTOMY    .  CHOLECYSTECTOMY    . ERCP    . EYE SURGERY    . TOTAL KNEE ARTHROPLASTY       reports that she has never smoked. She has never used smokeless tobacco. She reports that she does not drink alcohol or use drugs.  Allergies  Allergen Reactions  . Buprenorphine Hcl   . Morphine And Related     Family History  Problem Relation Age of Onset  . Stroke Father   . Hypertension Father   . Heart attack Father     Prior to Admission medications   Medication Sig Start Date End Date Taking? Authorizing Provider  Apremilast 30 MG TABS Take 1 tablet by mouth daily.    [provider]  calcitRIOL (ROCALTROL) 0.25 MCG capsule Take 0.25 mcg by mouth daily. Takes on Mon, Wed, and Friday    [provider]  dorzolamide-timolol (COSOPT) 22.3-6.8 MG/ML ophthalmic solution 1 drop Two (2) times a day.    [provider]  ELIQUIS 2.5 MG TABS tablet Take 2.5 mg by mouth 2 (two) times daily. 05/23/18   [provider]  famotidine (PEPCID) 20 MG tablet Take 1 tablet (20 mg total) by mouth daily. 12/27/18   Richardo Priest, MD  furosemide (LASIX) 40 MG tablet Take 1 tablet (40 mg total) by mouth 2 (two) times daily. Take 1 extra tablet every morning on Monday, Wednesday, Friday. 12/16/18   Richardo Priest, MD  Lancets MISC by Does not apply route.    [provider]  levothyroxine (  SYNTHROID, LEVOTHROID) 75 MCG tablet Take 75 mcg by mouth daily before breakfast.     [provider]  metoprolol tartrate (LOPRESSOR) 25 MG tablet Take 1 tablet (25 mg total) by mouth daily. 12/27/18   Richardo Priest, MD  montelukast (SINGULAIR) 10 MG tablet TK 1 T PO D 10/18/18   [provider]  ULORIC 40 MG tablet TK 1 T PO QD 12/27/17   [provider]    Physical Exam: Vitals:   02/16/19 0123  BP: 114/68  Pulse: 71  Resp: (!) 22  Temp: 98 F (36.7 C)  TempSrc: Oral  SpO2: 99%      Constitutional: NAD, calm, comfortable Vitals:   02/16/19 0123   BP: 114/68  Pulse: 71  Resp: (!) 22  Temp: 98 F (36.7 C)  TempSrc: Oral  SpO2: 99%   Eyes: PERRL, lids and conjunctivae normal ENMT: Mucous membranes are moist. Posterior pharynx clear of any exudate or lesions.Normal dentition.  Neck: normal, supple, no masses, no thyromegaly Respiratory: clear to auscultation bilaterally, no wheezing, no crackles. Normal respiratory effort. No accessory muscle use.  Cardiovascular: Regular rate and rhythm, no murmurs / rubs / gallops. No extremity edema. 2+ pedal pulses. No carotid bruits.  Abdomen: no tenderness, no masses palpated. No hepatosplenomegaly. Bowel sounds positive.  Musculoskeletal: no clubbing / cyanosis. No joint deformity upper and lower extremities. Good ROM, no contractures. Normal muscle tone.  Skin: no rashes, lesions, ulcers. No induration Neurologic: CN 2-12 grossly intact. Sensation intact, DTR normal. Strength 5/5 in all 4.  Psychiatric: Normal judgment and insight. Alert and oriented x 3. Normal mood.    Labs on Admission: I have personally reviewed following labs and imaging studies  CBC: No results for input(s): WBC, NEUTROABS, HGB, HCT, MCV, PLT in the last 168 hours. Basic Metabolic Panel: No results for input(s): NA, K, CL, CO2, GLUCOSE, BUN, CREATININE, CALCIUM, MG, PHOS in the last 168 hours. GFR: CrCl cannot be calculated (No successful lab value found.). Liver Function Tests: No results for input(s): AST, ALT, ALKPHOS, BILITOT, PROT, ALBUMIN in the last 168 hours. No results for input(s): LIPASE, AMYLASE in the last 168 hours. No results for input(s): AMMONIA in the last 168 hours. Coagulation Profile: No results for input(s): INR, PROTIME in the last 168 hours. Cardiac Enzymes: No results for input(s): CKTOTAL, CKMB, CKMBINDEX, TROPONINI in the last 168 hours. BNP (last 3 results) No results for input(s): PROBNP in the last 8760 hours. HbA1C: No results for input(s): HGBA1C in the last 72 hours. CBG: No  results for input(s): GLUCAP in the last 168 hours. Lipid Profile: No results for input(s): CHOL, HDL, LDLCALC, TRIG, CHOLHDL, LDLDIRECT in the last 72 hours. Thyroid Function Tests: No results for input(s): TSH, T4TOTAL, FREET4, T3FREE, THYROIDAB in the last 72 hours. Anemia Panel: No results for input(s): VITAMINB12, FOLATE, FERRITIN, TIBC, IRON, RETICCTPCT in the last 72 hours. Urine analysis: No results found for: COLORURINE, APPEARANCEUR, LABSPEC, PHURINE, GLUCOSEU, HGBUR, BILIRUBINUR, KETONESUR, PROTEINUR, UROBILINOGEN, NITRITE, LEUKOCYTESUR Sepsis Labs: !!!!!!!!!!!!!!!!!!!!!!!!!!!!!!!!!!!!!!!!!!!! @LABRCNTIP (procalcitonin:4,lacticidven:4) )No results found for this or any previous visit (from the past 240 hour(s)).   Radiological Exams on Admission: No results found.  Old chart reviewed  [facility] records reviewed temperature 100.9  chronic kidney disease creatinine 2.2 T bili 2.8 AST 1600 ALT 529 alk phos 330  White count 10.9  hemoglobin 14  platelets normal  INR normal  COVID - 97% O2 sats on room air with a heart rate of 93 blood pressure 115/70  CT shows pneumobilia consistent with a prior sphincterotomy gallbladder status post cholecystectomy no common bile duct dilation mentioned  Zosyn in the ED  Assessment/Plan 85 year old female with a sending cholangitis Principal Problem:   Ascending cholangitis-placed on IV Zosyn.  GI consulted Dr. Henrene Pastor called the [facility] will see in the morning.  Placed patient in stepdown overnight.  Repeat CMP in the morning.  Check lactic acid level and procalcitonin level.  Patient currently not septic blood pressure stable vitals all stable.  Repeat COVID testing here.  Active Problems:   Chronic anticoagulation-continue Eliquis   Chronic atrial fibrillation-currently rate controlled continue Eliquis   Chronic diastolic heart failure (HCC)-compensated at this time and holding diuretics secondary to above   Anemia, chronic  disease-hemoglobin stable   CKD (chronic kidney disease) stage 3, GFR 30-59 ml/min (HCC)-creatinine at baseline around 2   COPD (chronic obstructive pulmonary disease) (HCC)-stable and compensated     DVT prophylaxis: Eliquis Code Status: Full Family Communication: None Disposition Plan: Days Consults called: GI labauer Admission status: Admission   Foy Mungia A MD [facility]  If 7PM-7AM, please contact night-coverage [email or web address] Password [facility]  02/16/2019, 2:34 AM

## 2019-02-16 NOTE — Op Note (Signed)
[facility] Patient Name: Felicia Guzman Procedure Date : 02/16/2019 MRN: [id number] Attending MD: Docia Chuck. Henrene Pastor , MD Date of Birth: [birth year] CSN: [id number] Age: 85 Admit Type: Inpatient Procedure:                ERCP with removal of common bile duct stone Indications:              Abdominal pain in the right upper quadrant,                            Abnormal liver function test Providers:                Docia Chuck. Henrene Pastor, MD, Carlyn Reichert, RN, Ladona Ridgel, Technician, Elspeth Cho Tech.,                            Technician Referring MD:             [facility] hospitalist Medicines:                General Anesthesia Complications:            No immediate complications. Estimated Blood Loss:     Estimated blood loss: none. Procedure:                Pre-Anesthesia Assessment:                           - Prior to the procedure, a History and Physical                            was performed, and patient medications and                            allergies were reviewed. The patient is competent.                            The risks and benefits of the procedure and the                            sedation options and risks were discussed with the                            patient. All questions were answered and informed                            consent was obtained. Patient identification and                            proposed procedure were verified by the physician.                            Mental Status Examination: alert and oriented.  Airway Examination: normal oropharyngeal airway and                            neck mobility. Respiratory Examination: clear to                            auscultation. CV Examination: normal. Prophylactic                            Antibiotics: The patient does not require                            prophylactic antibiotics. Prior Anticoagulants: The   patient has taken Eliquis (apixaban), last dose was                            day of procedure. ASA Grade Assessment: III - A                            patient with severe systemic disease. After                            reviewing the risks and benefits, the patient was                            deemed in satisfactory condition to undergo the                            procedure. The anesthesia plan was to use moderate                            sedation / analgesia (conscious sedation).                            Immediately prior to administration of medications,                            the patient was re-assessed for adequacy to receive                            sedatives. The heart rate, respiratory rate, oxygen                            saturations, blood pressure, adequacy of pulmonary                            ventilation, and response to care were monitored                            throughout the procedure. The physical status of                            the patient was re-assessed after the procedure.  After obtaining informed consent, the scope was                            passed under direct vision. Throughout the                            procedure, the patient's blood pressure, pulse, and                            oxygen saturations were monitored continuously. The                            TJF-Q180V ([id number] Olympus duodenoscope was                            introduced through the mouth, and used to inject                            contrast into and used to inject contrast into the                            bile duct. The ERCP was accomplished without                            difficulty. The patient tolerated the procedure                            well. Scope In: Scope Out: Findings:      The esophagus was successfully intubated under direct vision. The scope       was advanced to a normal major papilla in the descending  duodenum       without detailed examination of the pharynx, larynx and associated       structures, and upper GI tract. The upper GI tract was grossly normal.       There was evidence of prior biliary sphincterotomy. There was a small       periampullary diverticulum. The bile duct was selectively and deeply       cannulated with the sphincterotome. Contrast was injected. I personally       interpreted the bile duct images. Opacification of the entire biliary       tree except for the gallbladder was successful. The common bile duct       contained one stone, which was 8 mm in diameter. The main bile duct was       dilated, with a stone causing an obstruction. The largest diameter was       13 mm. A cholecystectomy had been performed. A 0.035 inch straight       standard wire was passed into the biliary tree. The biliary tree was       swept with a 15 mm balloon starting at the bifurcation. All stones were       removed. No stones remained. Impression:               1. Choledocholithiasis status post ERC with  successful removal of common duct stone                           2. Status post previous sphincterotomy elsewhere.                            Did not need to be extended                           3. Status post cholecystectomy. Recommendation:           1. Low-fat diet                           2. Continue antibiotics                           3. Trend liver tests                           4. Inpatient GI team will continue to follow. I                            would anticipate discharge home tomorrow with a                            short course of oral antibiotic therapy given                            recent cholangitis                           **I discussed the results of the procedure with the                            patient's granddaughter [name] Graylon Good**                           . Procedure Code(s):        --- Professional ---                            [phone], Endoscopic retrograde                            cholangiopancreatography (ERCP); with removal of                            calculi/debris from biliary/pancreatic duct(s) Diagnosis Code(s):        --- Professional ---                           K80.51, Calculus of bile duct without cholangitis                            or cholecystitis with obstruction  Z90.49, Acquired absence of other specified parts                            of digestive tract                           R10.11, Right upper quadrant pain                           R94.5, Abnormal results of liver function studies CPT copyright 2019 American Medical Association. All rights reserved. The codes documented in this report are preliminary and upon coder review may  be revised to meet current compliance requirements. Docia Chuck. Henrene Pastor, MD 02/16/2019 2:31:24 PM This report has been signed electronically. Number of Addenda: 0

## 2019-02-16 NOTE — Anesthesia Procedure Notes (Signed)
Procedure Name: Intubation Date/Time: 02/16/2019 1:36 PM Performed by: Renato Shin, CRNA Pre-anesthesia Checklist: Patient identified, Emergency Drugs available, Suction available and Patient being monitored Patient Re-evaluated:Patient Re-evaluated prior to induction Oxygen Delivery Method: Circle system utilized Preoxygenation: Pre-oxygenation with 100% oxygen Induction Type: IV induction and Rapid sequence Laryngoscope Size: Miller and 2 Grade View: Grade I Tube type: Oral Tube size: 7.0 mm Number of attempts: 1 Airway Equipment and Method: Oral airway and Stylet Placement Confirmation: ETT inserted through vocal cords under direct vision,  positive ETCO2 and breath sounds checked- equal and bilateral Secured at: 21 cm Tube secured with: Tape Dental Injury: Teeth and Oropharynx as per pre-operative assessment

## 2019-02-16 NOTE — Progress Notes (Signed)
Pt to 5w from [place].  Pt is alert and oriented x4.  VS are stable.

## 2019-02-16 NOTE — Progress Notes (Signed)
Pharmacy Antibiotic Note  Felicia Guzman is a 85 y.o. female admitted on 02/16/2019 with intra abdominal infection.  Pharmacy has been consulted for zosyn dosing. She received zosyn 4.5 gm ~ 20:00 at [facility]: Zosyn 3.375 gm IV x 1 while awaiting SrCr results. Continue zosyn 2.25 gm IV q8 hours     Temp (24hrs), Avg:98 F (36.7 C), Min:98 F (36.7 C), Max:98 F (36.7 C)  Recent Labs  Lab 02/16/19 0243  WBC 8.1  CREATININE 2.20*  LATICACIDVEN 1.4    CrCl cannot be calculated (Unknown ideal weight.).    Allergies  Allergen Reactions  . Buprenorphine Hcl   . Morphine And Related     Thank you for allowing pharmacy to be a part of this patient's care.  Excell Seltzer Poteet 02/16/2019 4:04 AM

## 2019-02-16 NOTE — Progress Notes (Signed)
Paper copy of COVID-19 test done at [facility] on 02/15/2019, obtained this result is not detected.

## 2019-02-17 ENCOUNTER — Encounter (HOSPITAL_COMMUNITY): Payer: Self-pay | Admitting: Internal Medicine

## 2019-02-17 DIAGNOSIS — I130 Hypertensive heart and chronic kidney disease with heart failure and stage 1 through stage 4 chronic kidney disease, or unspecified chronic kidney disease: Secondary | ICD-10-CM | POA: Diagnosis not present

## 2019-02-17 DIAGNOSIS — K8050 Calculus of bile duct without cholangitis or cholecystitis without obstruction: Secondary | ICD-10-CM

## 2019-02-17 DIAGNOSIS — I482 Chronic atrial fibrillation, unspecified: Secondary | ICD-10-CM

## 2019-02-17 DIAGNOSIS — D631 Anemia in chronic kidney disease: Secondary | ICD-10-CM | POA: Diagnosis not present

## 2019-02-17 DIAGNOSIS — J449 Chronic obstructive pulmonary disease, unspecified: Secondary | ICD-10-CM | POA: Diagnosis not present

## 2019-02-17 DIAGNOSIS — Z7901 Long term (current) use of anticoagulants: Secondary | ICD-10-CM | POA: Diagnosis not present

## 2019-02-17 DIAGNOSIS — E039 Hypothyroidism, unspecified: Secondary | ICD-10-CM | POA: Diagnosis not present

## 2019-02-17 DIAGNOSIS — Z79899 Other long term (current) drug therapy: Secondary | ICD-10-CM | POA: Diagnosis not present

## 2019-02-17 DIAGNOSIS — N183 Chronic kidney disease, stage 3 (moderate): Secondary | ICD-10-CM

## 2019-02-17 DIAGNOSIS — K8030 Calculus of bile duct with cholangitis, unspecified, without obstruction: Secondary | ICD-10-CM | POA: Diagnosis not present

## 2019-02-17 DIAGNOSIS — I10 Essential (primary) hypertension: Secondary | ICD-10-CM

## 2019-02-17 DIAGNOSIS — I5032 Chronic diastolic (congestive) heart failure: Secondary | ICD-10-CM | POA: Diagnosis not present

## 2019-02-17 DIAGNOSIS — E1122 Type 2 diabetes mellitus with diabetic chronic kidney disease: Secondary | ICD-10-CM | POA: Diagnosis not present

## 2019-02-17 DIAGNOSIS — Z885 Allergy status to narcotic agent status: Secondary | ICD-10-CM | POA: Diagnosis not present

## 2019-02-17 DIAGNOSIS — K219 Gastro-esophageal reflux disease without esophagitis: Secondary | ICD-10-CM | POA: Diagnosis not present

## 2019-02-17 DIAGNOSIS — Z86718 Personal history of other venous thrombosis and embolism: Secondary | ICD-10-CM | POA: Diagnosis not present

## 2019-02-17 DIAGNOSIS — Z86711 Personal history of pulmonary embolism: Secondary | ICD-10-CM | POA: Diagnosis not present

## 2019-02-17 DIAGNOSIS — Z9049 Acquired absence of other specified parts of digestive tract: Secondary | ICD-10-CM | POA: Diagnosis not present

## 2019-02-17 DIAGNOSIS — K8309 Other cholangitis: Secondary | ICD-10-CM | POA: Diagnosis not present

## 2019-02-17 DIAGNOSIS — M109 Gout, unspecified: Secondary | ICD-10-CM | POA: Diagnosis not present

## 2019-02-17 DIAGNOSIS — E1140 Type 2 diabetes mellitus with diabetic neuropathy, unspecified: Secondary | ICD-10-CM | POA: Diagnosis not present

## 2019-02-17 DIAGNOSIS — M1990 Unspecified osteoarthritis, unspecified site: Secondary | ICD-10-CM | POA: Diagnosis not present

## 2019-02-17 LAB — HEPATIC FUNCTION PANEL
ALT: 174 U/L — ABNORMAL HIGH (ref 0–44)
AST: 177 U/L — ABNORMAL HIGH (ref 15–41)
Albumin: 2.6 g/dL — ABNORMAL LOW (ref 3.5–5.0)
Alkaline Phosphatase: 165 U/L — ABNORMAL HIGH (ref 38–126)
Bilirubin, Direct: 0.5 mg/dL — ABNORMAL HIGH (ref 0.0–0.2)
Indirect Bilirubin: 0.7 mg/dL (ref 0.3–0.9)
Total Bilirubin: 1.2 mg/dL (ref 0.3–1.2)
Total Protein: 5.5 g/dL — ABNORMAL LOW (ref 6.5–8.1)

## 2019-02-17 MED ORDER — METRONIDAZOLE 500 MG PO TABS: 500.0000 mg | ORAL_TABLET | Freq: Three times a day (TID) | ORAL | 0 refills | Status: AC

## 2019-02-17 MED ORDER — CIPROFLOXACIN HCL 500 MG PO TABS: 500.0000 mg | ORAL_TABLET | Freq: Two times a day (BID) | ORAL | 0 refills | Status: DC

## 2019-02-17 MED ORDER — CIPROFLOXACIN HCL 500 MG PO TABS: 500.0000 mg | ORAL_TABLET | Freq: Every day | ORAL | 0 refills | Status: AC

## 2019-02-17 MED ORDER — ONDANSETRON 8 MG PO TBDP: 8.0000 mg | ORAL_TABLET | Freq: Three times a day (TID) | ORAL | 0 refills | Status: AC | PRN

## 2019-02-17 MED ORDER — FAMOTIDINE 20 MG PO TABS: 20.0000 mg | ORAL_TABLET | Freq: Two times a day (BID) | ORAL | 1 refills | Status: AC

## 2019-02-17 MED ORDER — FUROSEMIDE 40 MG PO TABS: 40.0000 mg | ORAL_TABLET | Freq: Two times a day (BID) | ORAL | Status: DC

## 2019-02-17 NOTE — TOC Transition Note (Addendum)
Transition of Care [facility]) - CM/SW Discharge Note   Patient Details  Name: Felicia Guzman MRN: [id number] Date of Birth: 1919/11/11  Transition of Care [facility]) CM/SW Contact:  Sharin Mons, RN Phone Number: 02/17/2019, 2:06 PM   Clinical Narrative:   Presented with cholangitis.  From home alone. Uses walker intermittently with ambulation.   Plan:Transition to home today with home health services (RN). [facility] to follow. Per granddaughter (Felicia) family to provide 24/7 supervision once d/c.   Felicia Guzman    [phone]    Pt has transportation to home. States has no problems affording Rx meds.  Final next level of care: [place] Barriers to Discharge: Barriers Resolved   Patient Goals and CMS Choice Patient states their goals for this hospitalization and ongoing recovery are:: i want to go home CMS Medicare.gov Compare Post Acute Care list provided to:: Patient, Felicia( HCPOA) Choice offered to / list presented to : Patient, HC POA / Tawnya Crook)  Discharge Placement                       Discharge Plan and Services                DME Arranged: N/A DME Agency: NA       HH Arranged: RN [place] Agency: [place] ([place]) Date [place]: 02/17/19 Time [place]: 1406 Representative spoke with at [place]: Rolm Bookbinder  Social Determinants of Health ([place]) Interventions     Readmission Risk Interventions No flowsheet data found.

## 2019-02-17 NOTE — Progress Notes (Addendum)
Nsg Discharge Note  Admit Date:  02/16/2019 Discharge date: 02/17/2019   Felicia Guzman to be D/C'd Home per MD order.  AVS completed.  Copy for chart, and copy for patient signed, and dated. Patient/caregiver able to verbalize understanding.  Discharge Medication: Allergies as of 02/17/2019      Reactions   Buprenorphine Hcl    Morphine And Related       Medication List    TAKE these medications   acetaminophen 500 MG tablet Commonly known as:  TYLENOL Take 1,000 mg by mouth every 6 (six) hours as needed for mild pain (left knee pain).   Apremilast 30 MG Tabs Take 30 mg by mouth at bedtime.   brimonidine 0.2 % ophthalmic solution Commonly known as:  ALPHAGAN Place 1 drop into the left eye 2 (two) times a day.   calcitRIOL 0.25 MCG capsule Commonly known as:  ROCALTROL Take 0.25 mcg by mouth every Monday, Wednesday, and Friday.   ciprofloxacin 500 MG tablet Commonly known as:  Cipro Take 1 tablet (500 mg total) by mouth daily for 7 days.   dorzolamide-timolol 22.3-6.8 MG/ML ophthalmic solution Commonly known as:  COSOPT Place 1 drop into the left eye 2 (two) times daily.   Eliquis 2.5 MG Tabs tablet Generic drug:  apixaban Take 2.5 mg by mouth 2 (two) times daily.   famotidine 20 MG tablet Commonly known as:  PEPCID Take 1 tablet (20 mg total) by mouth 2 (two) times daily. What changed:  when to take this   furosemide 40 MG tablet Commonly known as:  LASIX Take 1-2 tablets (40-80 mg total) by mouth 2 (two) times daily. Take 1 extra tablet every morning on Monday, Wednesday, Friday. What changed:  how much to take   Lancets Misc by Does not apply route.   levothyroxine 75 MCG tablet Commonly known as:  SYNTHROID Take 75 mcg by mouth daily before breakfast.   metoprolol tartrate 25 MG tablet Commonly known as:  LOPRESSOR Take 1 tablet (25 mg total) by mouth daily.   metroNIDAZOLE 500 MG tablet Commonly known as:  Flagyl Take 1 tablet (500 mg total) by mouth  3 (three) times daily for 7 days.   montelukast 10 MG tablet Commonly known as:  SINGULAIR Take 10 mg by mouth daily.   ondansetron 8 MG disintegrating tablet Commonly known as:  Zofran ODT Take 1 tablet (8 mg total) by mouth every 8 (eight) hours as needed for nausea or vomiting.   Uloric 40 MG tablet Generic drug:  febuxostat Take 40 mg by mouth daily.       Discharge Assessment: Vitals:   02/17/19 0351 02/17/19 0700  BP:    Pulse:  87  Resp:  15  Temp: (!) 97.2 F (36.2 C)   SpO2:  98%   Skin clean, dry and intact without evidence of skin break down, no evidence of skin tears noted. IV catheter discontinued intact. Site without signs and symptoms of complications - no redness or edema noted at insertion site, patient denies c/o pain - only slight tenderness at site.  Dressing with slight pressure applied.  D/c Instructions-Education: Discharge instructions given to patient/family with verbalized understanding. D/c education completed with patient/family including follow up instructions, medication list, d/c activities limitations if indicated, with other d/c instructions as indicated by MD - patient able to verbalize understanding, all questions fully answered. Patient instructed to return to ED, call 911, or call MD for any changes in condition.  Patient escorted via  WC, and D/C home via private auto.  Tresa Endo, RN 02/17/2019 2:27 PM

## 2019-02-17 NOTE — Consult Note (Signed)
   [facility] CM Inpatient Consult   02/17/2019  Felicia Guzman Apr 07, 1920 [id number]  Thank you for the referral.  Patient evaluated for community based chronic disease management services with [place] [facility] as a benefit of patient's [employer].  Chart reviewed for needs from history and physical with a history that includes but not limited to HF and Diabetes as below:  As per H&P written by Dr. Shanon Brow on 02/16/19 85 y.o.femalewith medical history significant ofacute onset of mid epigastric abdominal pain associated with fever that resolved prior to being transferred here patient found to have markedly elevated LFTs with AST of 1600 ALT of 529 alk phos 330 and a total bili of 2.8 temperature of 100.9 patient extremely hard of hearing COVID test is negative white count 10.9. Patient is being referred for transfer secondary to concerns for ascending cholangitis. Spoke with patient's granddaughter, Lauro Regulus, patient's POA, via phone at patient's room, with inpatient [facility] RNCM at bedside to explain [place] Management services.  Patient will receive post hospital discharge call and for assessments for community needs for care/disease management.  Chart reviewed for disease management needs.  Granddaughter states patient lives alone and had been very independent pior to admission.  However, she is in need of some supervision over her medications, and could benefit from community support for disease management.   Of note, [facility] Care Management services does not replace or interfere with any services that are arranged by inpatient case management or social work.  For additional questions or referrals please contact:    Natividad Brood, RN BSN [facility] Liaison  [phone] business mobile phone Toll free office [phone]  Fax number: [phone] [email or web address]  [email or web address]

## 2019-02-17 NOTE — Care Management Obs Status (Signed)
[place] NOTIFICATION   Patient Details  Name: Felicia Guzman MRN: [id number] Date of Birth: 31-Dec-1919   Medicare Observation Status Notification Given:  Yes    Sharin Mons, RN 02/17/2019, 1:54 PM

## 2019-02-17 NOTE — Discharge Summary (Signed)
Physician Discharge Summary  Felicia Guzman UEK:[id number] DOB: 05-15-1920 DOA: 02/16/2019  PCP: Melony Overly, MD  Admit date: 02/16/2019 Discharge date: 02/17/2019  Time spent: 35 minutes  Recommendations for Outpatient Follow-up:  1. Repeat complete metabolic panel to follow renal function, electrolytes and LFTs 2. -Follow response to adjusted dose of famotidine to control gastroesophageal reflux disease.   Discharge Diagnoses:  Choledocholithiasis with ascending cholangitis Chronic anticoagulation Chronic atrial fibrillation Chronic diastolic heart failure (HCC) Anemia, chronic disease CKD (chronic kidney disease) stage 3, GFR 30-59 ml/min (HCC) COPD (chronic obstructive pulmonary disease) ([place]) Essential hypertension History of gout  Discharge Condition: Stable and improved.  Patient discharged home with instruction to follow-up with PCP in 10 days.  Diet recommendation: Heart healthy diet.  Filed Weights   02/16/19 1307 02/17/19 0616  Weight: 60.3 kg 62.4 kg    History of present illness:  As per H&P written by Dr. Shanon Brow on 02/16/19 85 y.o. female with medical history significant of acute onset of mid epigastric abdominal pain associated with fever that resolved prior to being transferred here patient found to have markedly elevated LFTs with AST of 1600 ALT of 529 alk phos 330 and a total bili of 2.8 temperature of 100.9 patient extremely hard of hearing COVID test is negative white count 10.9.  Patient is being referred for transfer secondary to concerns for ascending cholangitis.  Currently she is laying in bed resting comfortably with no complaints.  Hospital Course:  1-acute choledocholithiasis with ascending cholangitis -Status post ERCP with stone extractions on 02/16/2019. -Significant/drastic improvement in her LFTs -No abdominal pain, no nausea and just very mild episode of vomiting. -Otherwise tolerating pills by mouth. -Patient will be discharged home with  instruction to follow-up with PCP -Will need a repeat comprehensive metabolic panel to follow LFTs in about 10 days -Maintain adequate hydration and complete antibiotic therapy by mouth for ascending cholangitis using Cipro and Flagyl.  (7 days pending at discharge).  2-chronic atrial fibrillation -Continue beta-blocker for rate control -Continue Eliquis for secondary prevention.  3-hypothyroidism -Continue Synthroid  4-chronic kidney disease stage III -Stable and well-controlled -Repeat basic metabolic panel follow-up visit to assess renal function and stability. -Continue calcitriol  5-chronic diastolic heart failure -No shortness of breath -Condition is stable and compensated -Continue low-sodium diet, daily weights and the use of beta-blocker and diuretics as previously prescribed.  6-history of asthma -Continue the use of montelukast -Patient denies shortness of breath and is currently no wheezing.  7-gastroesophageal reflux disease -Continue the use of famotidine -Medication has been adjusted to 20 mg twice a day for better symptom control.  8-hx of Gout -no acute flare -continue uloric  9-essential hypertension -Stable and well-controlled -Continue current medication regimen -Patient advised to follow heart healthy diet.   Procedures:  ERCP: S/p ERCP with stone extractions on 02/16/2019  Consultations:  Gastroenterology  Discharge Exam: Vitals:   02/17/19 0351 02/17/19 0700  BP:    Pulse:  87  Resp:  15  Temp: (!) 97.2 F (36.2 C)   SpO2:  98%    General: Afebrile, no chest pain, no shortness of breath and no abdominal pain.  Patient reports no nausea; per family small vomiting insulin after eating breakfast too quickly this morning. Cardiovascular: S1 and S2, no rubs, no gallops, no JVD. Respiratory: Clear to auscultation bilaterally, normal respiratory effort. Abdomen: Soft, nontender, positive bowel sounds. Extremities: No cyanosis or  clubbing.  Discharge Instructions   Discharge Instructions    Diet - low  sodium heart healthy   Complete by:  As directed    Discharge instructions   Complete by:  As directed    Small meals multiple times a day Maintain adequate hydration Follow-up with PCP in 10 days     Allergies as of 02/17/2019      Reactions   Buprenorphine Hcl    Morphine And Related       Medication List    TAKE these medications   acetaminophen 500 MG tablet Commonly known as:  TYLENOL Take 1,000 mg by mouth every 6 (six) hours as needed for mild pain (left knee pain).   Apremilast 30 MG Tabs Take 30 mg by mouth at bedtime.   brimonidine 0.2 % ophthalmic solution Commonly known as:  ALPHAGAN Place 1 drop into the left eye 2 (two) times a day.   calcitRIOL 0.25 MCG capsule Commonly known as:  ROCALTROL Take 0.25 mcg by mouth every Monday, Wednesday, and Friday.   ciprofloxacin 500 MG tablet Commonly known as:  Cipro Take 1 tablet (500 mg total) by mouth daily for 7 days.   dorzolamide-timolol 22.3-6.8 MG/ML ophthalmic solution Commonly known as:  COSOPT Place 1 drop into the left eye 2 (two) times daily.   Eliquis 2.5 MG Tabs tablet Generic drug:  apixaban Take 2.5 mg by mouth 2 (two) times daily.   famotidine 20 MG tablet Commonly known as:  PEPCID Take 1 tablet (20 mg total) by mouth 2 (two) times daily. What changed:  when to take this   furosemide 40 MG tablet Commonly known as:  LASIX Take 1-2 tablets (40-80 mg total) by mouth 2 (two) times daily. Take 1 extra tablet every morning on Monday, Wednesday, Friday. What changed:  how much to take   Lancets Misc by Does not apply route.   levothyroxine 75 MCG tablet Commonly known as:  SYNTHROID Take 75 mcg by mouth daily before breakfast.   metoprolol tartrate 25 MG tablet Commonly known as:  LOPRESSOR Take 1 tablet (25 mg total) by mouth daily.   metroNIDAZOLE 500 MG tablet Commonly known as:  Flagyl Take 1 tablet (500  mg total) by mouth 3 (three) times daily for 7 days.   montelukast 10 MG tablet Commonly known as:  SINGULAIR Take 10 mg by mouth daily.   ondansetron 8 MG disintegrating tablet Commonly known as:  Zofran ODT Take 1 tablet (8 mg total) by mouth every 8 (eight) hours as needed for nausea or vomiting.   Uloric 40 MG tablet Generic drug:  febuxostat Take 40 mg by mouth daily.      Allergies  Allergen Reactions  . Buprenorphine Hcl   . Morphine And Related    Follow-up Information    Melony Overly, MD. Schedule an appointment as soon as possible for a visit in 10 day(s).   Specialty:  Family Medicine Contact information: [place] [address] [phone]           The results of significant diagnostics from this hospitalization (including imaging, microbiology, ancillary and laboratory) are listed below for reference.    Significant Diagnostic Studies: US Abdomen Complete  Addendum Date: 02/16/2019   ADDENDUM REPORT: 02/16/2019 14:41 ADDENDUM: This addendum is given after review of the initially dictated report and comparison CT scan in consultation with Dr. Scarlette Shorts. Based on review, the patient has extensive pneumobilia. A 0.8 cm in diameter stone is seen in the distal common bile duct and better visualized on the prior CT.  Electronically Signed   By: Inge Rise M.D.   On: 02/16/2019 14:41   Result Date: 02/16/2019 CLINICAL DATA:  Elevated LFTs, epigastric pain, fever EXAM: ABDOMEN ULTRASOUND COMPLETE COMPARISON:  CT abdomen/pelvis dated 02/15/2019 FINDINGS: Gallbladder: Surgically absent. Common bile duct: Diameter: 3 mm Liver: Mildly nodular hepatic contour. No focal hepatic lesion is seen. Portal vein is patent on color Doppler imaging with normal direction of blood flow towards the liver. IVC: No abnormality visualized. Pancreas: Visualized portion unremarkable. Spleen: Size and appearance within normal limits. Right Kidney: Length:  9.4 cm. Echogenic renal parenchyma, suggesting medical renal disease. No mass or hydronephrosis. Left Kidney: Length: 9.0 cm. Echogenic renal parenchyma, suggesting medical renal disease. No mass or hydronephrosis. Abdominal aorta: No aneurysm visualized. Other findings: None. IMPRESSION: Mildly nodular hepatic contour, raising the possibility of cirrhosis. No focal hepatic lesion is seen. Echogenic renal parenchyma, suggesting medical renal disease. No hydronephrosis. Electronically Signed: By: Julian Hy M.D. On: 02/16/2019 11:56   Dg Ercp Biliary & Pancreatic Ducts  Result Date: 02/17/2019 CLINICAL DATA:  Choledocholithiasis EXAM: ERCP TECHNIQUE: Multiple spot images obtained with the fluoroscopic device and submitted for interpretation post-procedure. FLUOROSCOPY TIME:  Fluoroscopy Time:  4 minutes and 39 seconds Radiation Exposure Index (if provided by the fluoroscopic device): Number of Acquired Spot Images: 0 COMPARISON:  None. FINDINGS: Imaging demonstrates cannulation of the common bile duct. Filling defects in the common bile duct are noted. Balloon stone retrieval is noted. IMPRESSION: See above. These images were submitted for radiologic interpretation only. Please see the procedural report for the amount of contrast and the fluoroscopy time utilized. Electronically Signed   By: Marybelle Killings M.D.   On: 02/17/2019 07:39    Microbiology: Recent Results (from the past 240 hour(s))  MRSA PCR Screening     Status: None   Collection Time: 02/16/19  7:24 AM  Result Value Ref Range Status   MRSA by PCR NEGATIVE NEGATIVE Final    Comment:        The GeneXpert MRSA Assay (FDA approved for NASAL specimens only), is one component of a comprehensive MRSA colonization surveillance program. It is not intended to diagnose MRSA infection nor to guide or monitor treatment for MRSA infections. Performed at [facility], [place] [address] [place], [place] [address]      Labs: Basic  Metabolic Panel: Recent Labs  Lab 02/16/19 0243  NA 138  K 4.1  CL 103  CO2 25  GLUCOSE 118*  BUN 27*  CREATININE 2.20*  CALCIUM 8.7*   Liver Function Tests: Recent Labs  Lab 02/16/19 0243 02/17/19 0909  AST 602* 177*  ALT 294* 174*  ALKPHOS 218* 165*  BILITOT 2.4* 1.2  PROT 5.4* 5.5*  ALBUMIN 2.7* 2.6*   CBC: Recent Labs  Lab 02/16/19 0243  WBC 8.1  HGB 11.6*  HCT 35.4*  MCV 91.2  PLT 165   Signed:  Barton Dubois MD.  [facility] 02/17/2019, 1:12 PM

## 2019-02-17 NOTE — Care Management CC44 (Signed)
Condition Code 44 Documentation Completed  Patient Details  Name: Felicia Guzman MRN: [id number] Date of Birth: 14-May-1920   Condition Code 44 given:  Yes Patient signature on Condition Code 44 notice:  Yes Documentation of 2 MD's agreement:  Yes Code 44 added to claim:  Yes    Sharin Mons, RN 02/17/2019, 1:55 PM

## 2019-02-17 NOTE — Progress Notes (Signed)
Progress Note   Subjective  Patient doing well today. No pain at all. LFTs much better. Had an episode of vomiting this AM. Family says her reflux is bothering her more than usual, on famotidine once daily.    Objective   Vital signs in last 24 hours: Temp:  [97.2 F (36.2 C)-98.7 F (37.1 C)] 97.2 F (36.2 C) (06/01 0351) Pulse Rate:  [71-101] 87 (06/01 0700) Resp:  [15-19] 15 (06/01 0700) BP: (83-121)/(57-72) 100/72 (05/31 1445) SpO2:  [96 %-100 %] 98 % (06/01 0700) Weight:  [60.3 kg-62.4 kg] 62.4 kg (06/01 0616) Last BM Date: 02/16/19 General:    white female in NAD Heart:  Regular rate and rhythm; no murmurs Lungs: Respirations even and unlabored, lungs CTA bilaterally Abdomen:  Soft, nontender and nondistended.  Extremities:  Without edema. Neurologic:  Alert and oriented,  grossly normal neurologically. Psych:  Cooperative. Normal mood and affect.  Intake/Output from previous day: 05/31 0701 - 06/01 0700 In: 500 [I.V.:500] Out: -  Intake/Output this shift: Total I/O In: 200 [P.O.:200] Out: -   Lab Results: Recent Labs    02/16/19 0243  WBC 8.1  HGB 11.6*  HCT 35.4*  PLT 165   BMET Recent Labs    02/16/19 0243  NA 138  K 4.1  CL 103  CO2 25  GLUCOSE 118*  BUN 27*  CREATININE 2.20*  CALCIUM 8.7*   LFT Recent Labs    02/17/19 0909  PROT 5.5*  ALBUMIN 2.6*  AST 177*  ALT 174*  ALKPHOS 165*  BILITOT 1.2  BILIDIR 0.5*  IBILI 0.7   PT/INR No results for input(s): LABPROT, INR in the last 72 hours.  Studies/Results: US Abdomen Complete  Addendum Date: 02/16/2019   ADDENDUM REPORT: 02/16/2019 14:41 ADDENDUM: This addendum is given after review of the initially dictated report and comparison CT scan in consultation with Dr. Scarlette Shorts. Based on review, the patient has extensive pneumobilia. A 0.8 cm in diameter stone is seen in the distal common bile duct and better visualized on the prior CT. Electronically Signed   By: Inge Rise M.D.   On: 02/16/2019 14:41   Result Date: 02/16/2019 CLINICAL DATA:  Elevated LFTs, epigastric pain, fever EXAM: ABDOMEN ULTRASOUND COMPLETE COMPARISON:  CT abdomen/pelvis dated 02/15/2019 FINDINGS: Gallbladder: Surgically absent. Common bile duct: Diameter: 3 mm Liver: Mildly nodular hepatic contour. No focal hepatic lesion is seen. Portal vein is patent on color Doppler imaging with normal direction of blood flow towards the liver. IVC: No abnormality visualized. Pancreas: Visualized portion unremarkable. Spleen: Size and appearance within normal limits. Right Kidney: Length: 9.4 cm. Echogenic renal parenchyma, suggesting medical renal disease. No mass or hydronephrosis. Left Kidney: Length: 9.0 cm. Echogenic renal parenchyma, suggesting medical renal disease. No mass or hydronephrosis. Abdominal aorta: No aneurysm visualized. Other findings: None. IMPRESSION: Mildly nodular hepatic contour, raising the possibility of cirrhosis. No focal hepatic lesion is seen. Echogenic renal parenchyma, suggesting medical renal disease. No hydronephrosis. Electronically Signed: By: Julian Hy M.D. On: 02/16/2019 11:56   Dg Ercp Biliary & Pancreatic Ducts  Result Date: 02/17/2019 CLINICAL DATA:  Choledocholithiasis EXAM: ERCP TECHNIQUE: Multiple spot images obtained with the fluoroscopic device and submitted for interpretation post-procedure. FLUOROSCOPY TIME:  Fluoroscopy Time:  4 minutes and 39 seconds Radiation Exposure Index (if provided by the fluoroscopic device): Number of Acquired Spot Images: 0 COMPARISON:  None. FINDINGS: Imaging demonstrates cannulation of the common bile duct. Filling defects in the common bile duct  are noted. Balloon stone retrieval is noted. IMPRESSION: See above. These images were submitted for radiologic interpretation only. Please see the procedural report for the amount of contrast and the fluoroscopy time utilized. Electronically Signed   By: Marybelle Killings M.D.   On:  02/17/2019 07:39       Assessment / Plan:   85 y/o female admitted with choledocholithiasis, history of cholecystectomy, s/p ERCP with stone extraction per Dr. Henrene Pastor yesterday. Stone removed without any apparent complications. LAEs significantly improved. She has no pain. Some worsening reflux lately and an episode of vomiting this AM, but she wants to go home. Spoke with family and hospitalist at bedside.   Plan on discharge home today. Plan for another 5-7 days of antibiotics (cipro / flagyl), she can continue Eliquis and the rest of her medications. Would increase famotidine to BID dosing and given some Zofran as needed if she has nausea. No specific GI follow up warranted after her hospitalization. All questions answered.  [place] Cellar, MD [facility] Gastroenterology

## 2019-02-18 ENCOUNTER — Other Ambulatory Visit: Payer: Self-pay | Admitting: Pharmacist

## 2019-02-18 DIAGNOSIS — H9190 Unspecified hearing loss, unspecified ear: Secondary | ICD-10-CM | POA: Diagnosis not present

## 2019-02-18 DIAGNOSIS — I482 Chronic atrial fibrillation, unspecified: Secondary | ICD-10-CM | POA: Diagnosis not present

## 2019-02-18 DIAGNOSIS — K8309 Other cholangitis: Secondary | ICD-10-CM | POA: Diagnosis not present

## 2019-02-18 DIAGNOSIS — Z5181 Encounter for therapeutic drug level monitoring: Secondary | ICD-10-CM | POA: Diagnosis not present

## 2019-02-18 DIAGNOSIS — E039 Hypothyroidism, unspecified: Secondary | ICD-10-CM | POA: Diagnosis not present

## 2019-02-18 DIAGNOSIS — J449 Chronic obstructive pulmonary disease, unspecified: Secondary | ICD-10-CM | POA: Diagnosis not present

## 2019-02-18 DIAGNOSIS — I5032 Chronic diastolic (congestive) heart failure: Secondary | ICD-10-CM | POA: Diagnosis not present

## 2019-02-18 DIAGNOSIS — M1990 Unspecified osteoarthritis, unspecified site: Secondary | ICD-10-CM | POA: Diagnosis not present

## 2019-02-18 DIAGNOSIS — N183 Chronic kidney disease, stage 3 (moderate): Secondary | ICD-10-CM | POA: Diagnosis not present

## 2019-02-18 DIAGNOSIS — I130 Hypertensive heart and chronic kidney disease with heart failure and stage 1 through stage 4 chronic kidney disease, or unspecified chronic kidney disease: Secondary | ICD-10-CM | POA: Diagnosis not present

## 2019-02-18 DIAGNOSIS — K219 Gastro-esophageal reflux disease without esophagitis: Secondary | ICD-10-CM | POA: Diagnosis not present

## 2019-02-18 DIAGNOSIS — Z7901 Long term (current) use of anticoagulants: Secondary | ICD-10-CM | POA: Diagnosis not present

## 2019-02-18 DIAGNOSIS — D631 Anemia in chronic kidney disease: Secondary | ICD-10-CM | POA: Diagnosis not present

## 2019-02-18 DIAGNOSIS — E785 Hyperlipidemia, unspecified: Secondary | ICD-10-CM | POA: Diagnosis not present

## 2019-02-18 DIAGNOSIS — E1122 Type 2 diabetes mellitus with diabetic chronic kidney disease: Secondary | ICD-10-CM | POA: Diagnosis not present

## 2019-02-18 DIAGNOSIS — E1140 Type 2 diabetes mellitus with diabetic neuropathy, unspecified: Secondary | ICD-10-CM | POA: Diagnosis not present

## 2019-02-18 DIAGNOSIS — M109 Gout, unspecified: Secondary | ICD-10-CM | POA: Diagnosis not present

## 2019-02-18 DIAGNOSIS — D689 Coagulation defect, unspecified: Secondary | ICD-10-CM | POA: Diagnosis not present

## 2019-02-18 DIAGNOSIS — Z86711 Personal history of pulmonary embolism: Secondary | ICD-10-CM | POA: Diagnosis not present

## 2019-02-18 DIAGNOSIS — M810 Age-related osteoporosis without current pathological fracture: Secondary | ICD-10-CM | POA: Diagnosis not present

## 2019-02-18 NOTE — Patient Outreach (Signed)
[place] [facility]) Care Management  [place]   02/18/2019  Felicia Guzman 09/06/1920 [id number]  Reason for referral: Medication Review, Medication Adherence  Referral source: [place] Utilization Management Department Current insurance: [facility]  PMHx includes but not limited to:  CHF, chronic atrial fibrillation, CKD-III, COPD, T2DM, frequent falls, HTN, HLD, thyroid disease, gout, recent hospitalization for choledocholithiasis with ascending cholangitis s/p ERCP with stone extractions 02/16/2019.   Outreach:  10:30AM Unsuccessful telephone call attempt #1 to patient. Spoke with granddaughter [name] who requests that I call back later today due to home health nurses in the home at present.   2:30PM Unsuccessful outreach call attempt this afternoon to granddaughter.  HIPAA compliant voicemail left requesting a return call.   Plan:  -I will make another outreach attempt to patient within 3-4 business days.   -Will mail unsuccessful outreach letter to patient's home  Ralene Bathe, PharmD, [place] [phone]

## 2019-02-19 ENCOUNTER — Other Ambulatory Visit: Payer: Self-pay

## 2019-02-19 NOTE — Patient Outreach (Signed)
New referral: Telephone assessment: Discharged on 02/17/2019.  MD office does transition of care:  Placed call to patient and spoke with granddaughter who is health care power of attorney:  Granddaughter is Psychologist, educational.  [phone].  Ms. Baxter Flattery reports patient is doing well. Reports home health nurse came yesterday for a visit.  Patient walks with walker and has no recent falls.  Granddaughter reports her concern is patient taking the right medications at the right time.  Reports she has called [facility] pharmacist back and left a message.   Granddaughter reports MD office has called to check on patient and scheduling a follow up visit.   Reviewed [facility] program with assistance from nursing, pharmacy and social worker. Granddaughter reports no nursing needs and no social work needs at this time.  Reports she wants to talk to Education officer, museum.   PLAN: will close to nursing as no needs identified and MD office does transition of care. Will send a successful outreach letter and I have confirmed address. Sent an in basket message to Vici to call granddaughter.   Tomasa Rand, RN, BSN, CEN [facility] [employer] [phone]

## 2019-02-20 DIAGNOSIS — J449 Chronic obstructive pulmonary disease, unspecified: Secondary | ICD-10-CM | POA: Diagnosis not present

## 2019-02-20 DIAGNOSIS — H9190 Unspecified hearing loss, unspecified ear: Secondary | ICD-10-CM | POA: Diagnosis not present

## 2019-02-20 DIAGNOSIS — I130 Hypertensive heart and chronic kidney disease with heart failure and stage 1 through stage 4 chronic kidney disease, or unspecified chronic kidney disease: Secondary | ICD-10-CM | POA: Diagnosis not present

## 2019-02-20 DIAGNOSIS — I482 Chronic atrial fibrillation, unspecified: Secondary | ICD-10-CM | POA: Diagnosis not present

## 2019-02-20 DIAGNOSIS — D689 Coagulation defect, unspecified: Secondary | ICD-10-CM | POA: Diagnosis not present

## 2019-02-20 DIAGNOSIS — I5032 Chronic diastolic (congestive) heart failure: Secondary | ICD-10-CM | POA: Diagnosis not present

## 2019-02-20 DIAGNOSIS — D631 Anemia in chronic kidney disease: Secondary | ICD-10-CM | POA: Diagnosis not present

## 2019-02-20 DIAGNOSIS — M109 Gout, unspecified: Secondary | ICD-10-CM | POA: Diagnosis not present

## 2019-02-20 DIAGNOSIS — Z86711 Personal history of pulmonary embolism: Secondary | ICD-10-CM | POA: Diagnosis not present

## 2019-02-20 DIAGNOSIS — K8309 Other cholangitis: Secondary | ICD-10-CM | POA: Diagnosis not present

## 2019-02-20 DIAGNOSIS — E039 Hypothyroidism, unspecified: Secondary | ICD-10-CM | POA: Diagnosis not present

## 2019-02-20 DIAGNOSIS — E1122 Type 2 diabetes mellitus with diabetic chronic kidney disease: Secondary | ICD-10-CM | POA: Diagnosis not present

## 2019-02-20 DIAGNOSIS — E785 Hyperlipidemia, unspecified: Secondary | ICD-10-CM | POA: Diagnosis not present

## 2019-02-20 DIAGNOSIS — Z5181 Encounter for therapeutic drug level monitoring: Secondary | ICD-10-CM | POA: Diagnosis not present

## 2019-02-20 DIAGNOSIS — M1990 Unspecified osteoarthritis, unspecified site: Secondary | ICD-10-CM | POA: Diagnosis not present

## 2019-02-20 DIAGNOSIS — N183 Chronic kidney disease, stage 3 (moderate): Secondary | ICD-10-CM | POA: Diagnosis not present

## 2019-02-20 DIAGNOSIS — K219 Gastro-esophageal reflux disease without esophagitis: Secondary | ICD-10-CM | POA: Diagnosis not present

## 2019-02-20 DIAGNOSIS — E1140 Type 2 diabetes mellitus with diabetic neuropathy, unspecified: Secondary | ICD-10-CM | POA: Diagnosis not present

## 2019-02-20 DIAGNOSIS — M810 Age-related osteoporosis without current pathological fracture: Secondary | ICD-10-CM | POA: Diagnosis not present

## 2019-02-20 DIAGNOSIS — Z7901 Long term (current) use of anticoagulants: Secondary | ICD-10-CM | POA: Diagnosis not present

## 2019-02-21 ENCOUNTER — Ambulatory Visit: Payer: Self-pay | Admitting: Pharmacist

## 2019-02-21 ENCOUNTER — Other Ambulatory Visit: Payer: Self-pay | Admitting: Pharmacist

## 2019-02-21 NOTE — Patient Outreach (Signed)
[place] [facility]) Care Management  [place]  02/21/2019  Felicia Guzman 03-Jun-1920 [id number]   Reason for referral: Medication Review, Medication Adherence  Referral source: [place] Utilization Management Department Current insurance: [facility]  PMHx includes but not limited to:  CHF, chronic atrial fibrillation, CKD-III, COPD, T2DM, frequent falls, HTN, HLD, thyroid disease, gout, recent hospitalization for choledocholithiasis with ascending cholangitis s/p ERCP with stone extractions 02/16/2019.    Outreach:  Unsuccessful telephone call attempt #2 to patient's granddaughter.  She requests that I call her next week any day after 4:30PM  Plan:  -Will contact patient and granddaughter next Monday at 4:30PM.   Ralene Bathe, PharmD, [place] [phone]

## 2019-02-24 ENCOUNTER — Ambulatory Visit: Payer: Self-pay | Admitting: Pharmacist

## 2019-02-24 ENCOUNTER — Other Ambulatory Visit: Payer: Self-pay | Admitting: Pharmacist

## 2019-02-24 NOTE — Patient Outreach (Signed)
Felicia Guzman) Care Management  [place]   02/24/2019  Felicia Guzman 1920/04/08 [id number]  Reason for referral: Medication Review, Medication Adherence  Referral source: Felicia Guzman Utilization Management Department Current insurance: [facility]  PMHx includes but not limited to:  CHF, chronic atrial fibrillation, CKD-III, COPD, T2DM, frequent falls, HTN, HLD, thyroid disease, gout,recent hospitalization for choledocholithiasis with ascending cholangitis s/p ERCP with stone extractions 02/16/2019.   Outreach:  Successful telephone call with [name]'s granddaughter, Felicia Guzman.  HIPAA identifiers verified.   Subjective:  Granddaughter reports patient lives by herself but that she comes by every afternoon / evening to give her medications.  She has been leaving out morning medications for patient to take which has been working for the last few days.  Patient will sometimes be confused on medications and skip it.  Granddaughter is wondering about ways to improve patient's adherence if she is alone in the morning.   Objective:  Lab Results  Component Value Date   CREATININE 2.20 (H) 02/16/2019    No results found for: HGBA1C  Lipid Panel  No results found for: CHOL, TRIG, HDL, CHOLHDL, VLDL, LDLCALC, LDLDIRECT  BP Readings from Last 3 Encounters:  02/16/19 100/72  12/27/18 (!) 94/52  06/14/18 118/82    Allergies  Allergen Reactions  . Buprenorphine Hcl   . Morphine And Related     Medications Reviewed Today    Reviewed by Felicia Guzman, [facility] (Pharmacist) on 02/24/19 at [place] List Status: <None>  Medication Order Taking? Sig Documenting Provider Last Dose Status Informant  acetaminophen (TYLENOL) 500 MG tablet [id number] Yes Take 1,000 mg by mouth every 6 (six) hours as needed for mild pain (left knee pain). [provider] Taking Active Family Member  Apremilast 30 MG TABS [id number] Yes Take 30 mg by mouth at bedtime.   [provider] Taking Active Family Member  brimonidine (ALPHAGAN) 0.2 % ophthalmic solution [id number] Yes Place 1 drop into the left eye 2 (two) times a day. [provider] Taking Active Family Member  calcitRIOL (ROCALTROL) 0.25 MCG capsule [id number] Yes Take 0.25 mcg by mouth every Monday, Wednesday, and Friday.  [provider] Taking Active Family Member  ciprofloxacin (CIPRO) 500 MG tablet [id number] Yes Take 1 tablet (500 mg total) by mouth daily for 7 days. Barton Dubois, MD Taking Active   dorzolamide-timolol (COSOPT) 22.3-6.8 MG/ML ophthalmic solution [id number] Yes Place 1 drop into the left eye 2 (two) times daily.  [provider] Taking Active Family Member  ELIQUIS 2.5 MG TABS tablet [id number] Yes Take 2.5 mg by mouth 2 (two) times daily. [provider] Taking Active Family Member  famotidine (PEPCID) 20 MG tablet [id number] Yes Take 1 tablet (20 mg total) by mouth 2 (two) times daily. Barton Dubois, MD Taking Active   furosemide (LASIX) 40 MG tablet [id number] Yes Take 1-2 tablets (40-80 mg total) by mouth 2 (two) times daily. Take 1 extra tablet every morning on Monday, Wednesday, Friday.  Patient taking differently:  Take 40 mg by mouth 2 (two) times daily. Take 1 extra tablet every morning on Monday, Wednesday, Friday.   Barton Dubois, MD Taking Active   Lancets [place] [id number] Yes by Does not apply route. [provider] Taking Active Family Member  levothyroxine (SYNTHROID, LEVOTHROID) 75 MCG tablet [id number] Yes Take 75 mcg by mouth daily before breakfast.  [provider] Taking Active Family Member  metoprolol tartrate (LOPRESSOR) 25 MG tablet [id number] Yes  Take 1 tablet (25 mg total) by mouth daily. Richardo Priest, MD Taking Active Family Member  metroNIDAZOLE (FLAGYL) 500 MG tablet [id number] Yes Take 1 tablet (500 mg total) by mouth 3 (three) times daily for 7 days. Barton Dubois, MD Taking Active   montelukast  (SINGULAIR) 10 MG tablet [id number] Yes Take 10 mg by mouth daily.  [provider] Taking Active Family Member  ondansetron (ZOFRAN ODT) 8 MG disintegrating tablet [id number] No Take 1 tablet (8 mg total) by mouth every 8 (eight) hours as needed for nausea or vomiting.  Patient not taking:  Reported on 02/24/2019   Barton Dubois, MD Not Taking Active   ULORIC 40 MG tablet [id number] Yes Take 40 mg by mouth daily.  [provider] Taking Active Family Member          Assessment: Drugs sorted by system:  Hematologic: apixaban  Cardiovascular: furosemide, metoprolol  Pulmonary/Allergy:montelukast  Gastrointestinal: famotidine, ondansetron  Renal:calcitriol  Topical:brimonidine, dorzolamide-timolol  Pain: acetaminophen  Infectious Diseases:ciprofloxacin, metronidazole  Miscellaneous: apremilast, feboxustat  Medication Review Findings:  . Antibiotics: will complete course tomorrow o Patient has been skipping evening dose of metronidazole.  Advised patient to not prolong course until all pills are taken, rather finish at 7 days to avoid risk of GI toxicity . Metoprolol: only taking once daily rather than BID per MD due to hypotension . Apixaban: no issues with bleeding, counseled granddaughter on monitoring for signs and symptoms of bleeding . Adherence:  Occasional issues with compliance due to confusion.  Reviewed pillboxes that can lock / open on a timer, hiring an aide or friend to sit with patient in the morning to give medication, placing timers or calendars near medications, ect.  Granddaughter appreciative of suggestions.  She will continue to set out medications in a small cup each night for the morning dose as this system seems to be working for now.  She denies other medications questions or concerns.   Plan: . Will close [facility] pharmacy case as no further medication needs identified at this time.  Am happy to assist in the future as needed.     Ralene Bathe,  PharmD, [place] [phone]

## 2019-02-25 DIAGNOSIS — E785 Hyperlipidemia, unspecified: Secondary | ICD-10-CM | POA: Diagnosis not present

## 2019-02-25 DIAGNOSIS — M810 Age-related osteoporosis without current pathological fracture: Secondary | ICD-10-CM | POA: Diagnosis not present

## 2019-02-25 DIAGNOSIS — J449 Chronic obstructive pulmonary disease, unspecified: Secondary | ICD-10-CM | POA: Diagnosis not present

## 2019-02-25 DIAGNOSIS — K8309 Other cholangitis: Secondary | ICD-10-CM | POA: Diagnosis not present

## 2019-02-25 DIAGNOSIS — E039 Hypothyroidism, unspecified: Secondary | ICD-10-CM | POA: Diagnosis not present

## 2019-02-25 DIAGNOSIS — E1122 Type 2 diabetes mellitus with diabetic chronic kidney disease: Secondary | ICD-10-CM | POA: Diagnosis not present

## 2019-02-25 DIAGNOSIS — Z7901 Long term (current) use of anticoagulants: Secondary | ICD-10-CM | POA: Diagnosis not present

## 2019-02-25 DIAGNOSIS — M109 Gout, unspecified: Secondary | ICD-10-CM | POA: Diagnosis not present

## 2019-02-25 DIAGNOSIS — I130 Hypertensive heart and chronic kidney disease with heart failure and stage 1 through stage 4 chronic kidney disease, or unspecified chronic kidney disease: Secondary | ICD-10-CM | POA: Diagnosis not present

## 2019-02-25 DIAGNOSIS — I5032 Chronic diastolic (congestive) heart failure: Secondary | ICD-10-CM | POA: Diagnosis not present

## 2019-02-25 DIAGNOSIS — D689 Coagulation defect, unspecified: Secondary | ICD-10-CM | POA: Diagnosis not present

## 2019-02-25 DIAGNOSIS — N183 Chronic kidney disease, stage 3 (moderate): Secondary | ICD-10-CM | POA: Diagnosis not present

## 2019-02-25 DIAGNOSIS — H9190 Unspecified hearing loss, unspecified ear: Secondary | ICD-10-CM | POA: Diagnosis not present

## 2019-02-25 DIAGNOSIS — E1140 Type 2 diabetes mellitus with diabetic neuropathy, unspecified: Secondary | ICD-10-CM | POA: Diagnosis not present

## 2019-02-25 DIAGNOSIS — K219 Gastro-esophageal reflux disease without esophagitis: Secondary | ICD-10-CM | POA: Diagnosis not present

## 2019-02-25 DIAGNOSIS — I482 Chronic atrial fibrillation, unspecified: Secondary | ICD-10-CM | POA: Diagnosis not present

## 2019-02-25 DIAGNOSIS — M1990 Unspecified osteoarthritis, unspecified site: Secondary | ICD-10-CM | POA: Diagnosis not present

## 2019-02-25 DIAGNOSIS — Z5181 Encounter for therapeutic drug level monitoring: Secondary | ICD-10-CM | POA: Diagnosis not present

## 2019-02-25 DIAGNOSIS — Z86711 Personal history of pulmonary embolism: Secondary | ICD-10-CM | POA: Diagnosis not present

## 2019-02-25 DIAGNOSIS — D631 Anemia in chronic kidney disease: Secondary | ICD-10-CM | POA: Diagnosis not present

## 2019-02-26 DIAGNOSIS — K219 Gastro-esophageal reflux disease without esophagitis: Secondary | ICD-10-CM | POA: Diagnosis not present

## 2019-02-26 DIAGNOSIS — E785 Hyperlipidemia, unspecified: Secondary | ICD-10-CM | POA: Diagnosis not present

## 2019-02-26 DIAGNOSIS — I130 Hypertensive heart and chronic kidney disease with heart failure and stage 1 through stage 4 chronic kidney disease, or unspecified chronic kidney disease: Secondary | ICD-10-CM | POA: Diagnosis not present

## 2019-02-26 DIAGNOSIS — D631 Anemia in chronic kidney disease: Secondary | ICD-10-CM | POA: Diagnosis not present

## 2019-02-26 DIAGNOSIS — I5032 Chronic diastolic (congestive) heart failure: Secondary | ICD-10-CM | POA: Diagnosis not present

## 2019-02-26 DIAGNOSIS — E1140 Type 2 diabetes mellitus with diabetic neuropathy, unspecified: Secondary | ICD-10-CM | POA: Diagnosis not present

## 2019-02-26 DIAGNOSIS — Z7901 Long term (current) use of anticoagulants: Secondary | ICD-10-CM | POA: Diagnosis not present

## 2019-02-26 DIAGNOSIS — E039 Hypothyroidism, unspecified: Secondary | ICD-10-CM | POA: Diagnosis not present

## 2019-02-26 DIAGNOSIS — I482 Chronic atrial fibrillation, unspecified: Secondary | ICD-10-CM | POA: Diagnosis not present

## 2019-02-26 DIAGNOSIS — H9190 Unspecified hearing loss, unspecified ear: Secondary | ICD-10-CM | POA: Diagnosis not present

## 2019-02-26 DIAGNOSIS — K8309 Other cholangitis: Secondary | ICD-10-CM | POA: Diagnosis not present

## 2019-02-26 DIAGNOSIS — D689 Coagulation defect, unspecified: Secondary | ICD-10-CM | POA: Diagnosis not present

## 2019-02-26 DIAGNOSIS — M1990 Unspecified osteoarthritis, unspecified site: Secondary | ICD-10-CM | POA: Diagnosis not present

## 2019-02-26 DIAGNOSIS — J449 Chronic obstructive pulmonary disease, unspecified: Secondary | ICD-10-CM | POA: Diagnosis not present

## 2019-02-26 DIAGNOSIS — M109 Gout, unspecified: Secondary | ICD-10-CM | POA: Diagnosis not present

## 2019-02-26 DIAGNOSIS — E1122 Type 2 diabetes mellitus with diabetic chronic kidney disease: Secondary | ICD-10-CM | POA: Diagnosis not present

## 2019-02-26 DIAGNOSIS — N183 Chronic kidney disease, stage 3 (moderate): Secondary | ICD-10-CM | POA: Diagnosis not present

## 2019-02-26 DIAGNOSIS — Z5181 Encounter for therapeutic drug level monitoring: Secondary | ICD-10-CM | POA: Diagnosis not present

## 2019-02-26 DIAGNOSIS — Z86711 Personal history of pulmonary embolism: Secondary | ICD-10-CM | POA: Diagnosis not present

## 2019-02-26 DIAGNOSIS — M810 Age-related osteoporosis without current pathological fracture: Secondary | ICD-10-CM | POA: Diagnosis not present

## 2019-02-27 DIAGNOSIS — K8309 Other cholangitis: Secondary | ICD-10-CM | POA: Diagnosis not present

## 2019-02-27 DIAGNOSIS — I509 Heart failure, unspecified: Secondary | ICD-10-CM | POA: Diagnosis not present

## 2019-02-27 DIAGNOSIS — Z7689 Persons encountering health services in other specified circumstances: Secondary | ICD-10-CM | POA: Diagnosis not present

## 2019-03-06 DIAGNOSIS — N183 Chronic kidney disease, stage 3 (moderate): Secondary | ICD-10-CM | POA: Diagnosis not present

## 2019-03-06 DIAGNOSIS — Z5181 Encounter for therapeutic drug level monitoring: Secondary | ICD-10-CM | POA: Diagnosis not present

## 2019-03-06 DIAGNOSIS — I130 Hypertensive heart and chronic kidney disease with heart failure and stage 1 through stage 4 chronic kidney disease, or unspecified chronic kidney disease: Secondary | ICD-10-CM | POA: Diagnosis not present

## 2019-03-06 DIAGNOSIS — I5032 Chronic diastolic (congestive) heart failure: Secondary | ICD-10-CM | POA: Diagnosis not present

## 2019-03-06 DIAGNOSIS — K219 Gastro-esophageal reflux disease without esophagitis: Secondary | ICD-10-CM | POA: Diagnosis not present

## 2019-03-06 DIAGNOSIS — E1122 Type 2 diabetes mellitus with diabetic chronic kidney disease: Secondary | ICD-10-CM | POA: Diagnosis not present

## 2019-03-06 DIAGNOSIS — E039 Hypothyroidism, unspecified: Secondary | ICD-10-CM | POA: Diagnosis not present

## 2019-03-06 DIAGNOSIS — Z86711 Personal history of pulmonary embolism: Secondary | ICD-10-CM | POA: Diagnosis not present

## 2019-03-06 DIAGNOSIS — E785 Hyperlipidemia, unspecified: Secondary | ICD-10-CM | POA: Diagnosis not present

## 2019-03-06 DIAGNOSIS — M1990 Unspecified osteoarthritis, unspecified site: Secondary | ICD-10-CM | POA: Diagnosis not present

## 2019-03-06 DIAGNOSIS — E1140 Type 2 diabetes mellitus with diabetic neuropathy, unspecified: Secondary | ICD-10-CM | POA: Diagnosis not present

## 2019-03-06 DIAGNOSIS — I482 Chronic atrial fibrillation, unspecified: Secondary | ICD-10-CM | POA: Diagnosis not present

## 2019-03-06 DIAGNOSIS — Z7901 Long term (current) use of anticoagulants: Secondary | ICD-10-CM | POA: Diagnosis not present

## 2019-03-06 DIAGNOSIS — M109 Gout, unspecified: Secondary | ICD-10-CM | POA: Diagnosis not present

## 2019-03-06 DIAGNOSIS — H9190 Unspecified hearing loss, unspecified ear: Secondary | ICD-10-CM | POA: Diagnosis not present

## 2019-03-06 DIAGNOSIS — K8309 Other cholangitis: Secondary | ICD-10-CM | POA: Diagnosis not present

## 2019-03-06 DIAGNOSIS — J449 Chronic obstructive pulmonary disease, unspecified: Secondary | ICD-10-CM | POA: Diagnosis not present

## 2019-03-06 DIAGNOSIS — D631 Anemia in chronic kidney disease: Secondary | ICD-10-CM | POA: Diagnosis not present

## 2019-03-06 DIAGNOSIS — M810 Age-related osteoporosis without current pathological fracture: Secondary | ICD-10-CM | POA: Diagnosis not present

## 2019-03-06 DIAGNOSIS — D689 Coagulation defect, unspecified: Secondary | ICD-10-CM | POA: Diagnosis not present

## 2019-03-11 DIAGNOSIS — D689 Coagulation defect, unspecified: Secondary | ICD-10-CM | POA: Diagnosis not present

## 2019-03-11 DIAGNOSIS — N183 Chronic kidney disease, stage 3 (moderate): Secondary | ICD-10-CM | POA: Diagnosis not present

## 2019-03-11 DIAGNOSIS — M1990 Unspecified osteoarthritis, unspecified site: Secondary | ICD-10-CM | POA: Diagnosis not present

## 2019-03-11 DIAGNOSIS — Z86711 Personal history of pulmonary embolism: Secondary | ICD-10-CM | POA: Diagnosis not present

## 2019-03-11 DIAGNOSIS — K219 Gastro-esophageal reflux disease without esophagitis: Secondary | ICD-10-CM | POA: Diagnosis not present

## 2019-03-11 DIAGNOSIS — I5032 Chronic diastolic (congestive) heart failure: Secondary | ICD-10-CM | POA: Diagnosis not present

## 2019-03-11 DIAGNOSIS — E785 Hyperlipidemia, unspecified: Secondary | ICD-10-CM | POA: Diagnosis not present

## 2019-03-11 DIAGNOSIS — Z7901 Long term (current) use of anticoagulants: Secondary | ICD-10-CM | POA: Diagnosis not present

## 2019-03-11 DIAGNOSIS — M109 Gout, unspecified: Secondary | ICD-10-CM | POA: Diagnosis not present

## 2019-03-11 DIAGNOSIS — E039 Hypothyroidism, unspecified: Secondary | ICD-10-CM | POA: Diagnosis not present

## 2019-03-11 DIAGNOSIS — D631 Anemia in chronic kidney disease: Secondary | ICD-10-CM | POA: Diagnosis not present

## 2019-03-11 DIAGNOSIS — I482 Chronic atrial fibrillation, unspecified: Secondary | ICD-10-CM | POA: Diagnosis not present

## 2019-03-11 DIAGNOSIS — Z5181 Encounter for therapeutic drug level monitoring: Secondary | ICD-10-CM | POA: Diagnosis not present

## 2019-03-11 DIAGNOSIS — E1140 Type 2 diabetes mellitus with diabetic neuropathy, unspecified: Secondary | ICD-10-CM | POA: Diagnosis not present

## 2019-03-11 DIAGNOSIS — H9190 Unspecified hearing loss, unspecified ear: Secondary | ICD-10-CM | POA: Diagnosis not present

## 2019-03-11 DIAGNOSIS — M810 Age-related osteoporosis without current pathological fracture: Secondary | ICD-10-CM | POA: Diagnosis not present

## 2019-03-11 DIAGNOSIS — K8309 Other cholangitis: Secondary | ICD-10-CM | POA: Diagnosis not present

## 2019-03-11 DIAGNOSIS — E1122 Type 2 diabetes mellitus with diabetic chronic kidney disease: Secondary | ICD-10-CM | POA: Diagnosis not present

## 2019-03-11 DIAGNOSIS — J449 Chronic obstructive pulmonary disease, unspecified: Secondary | ICD-10-CM | POA: Diagnosis not present

## 2019-03-11 DIAGNOSIS — I130 Hypertensive heart and chronic kidney disease with heart failure and stage 1 through stage 4 chronic kidney disease, or unspecified chronic kidney disease: Secondary | ICD-10-CM | POA: Diagnosis not present

## 2019-03-17 ENCOUNTER — Other Ambulatory Visit: Payer: Self-pay | Admitting: Cardiology

## 2019-03-17 MED ORDER — FUROSEMIDE 40 MG PO TABS: 40.0000 mg | ORAL_TABLET | Freq: Two times a day (BID) | ORAL | 3 refills | Status: DC

## 2019-03-17 NOTE — Telephone Encounter (Signed)
Lasix refill sent to [place]

## 2019-03-17 NOTE — Telephone Encounter (Signed)
°*  STAT* If patient is at the pharmacy, call can be transferred to refill team.   1. Which medications need to be refilled? (please list name of each medication and dose if known) furosemide (LASIX) 40 MG tablet   2. Which pharmacy/location (including street and city if local pharmacy) is medication to be sent to?  [facility] [place], [address] [phone] (Phone) [phone] (Fax)    3. Do they need a 30 day or 90 day supply? 90 day

## 2019-04-14 DIAGNOSIS — L400 Psoriasis vulgaris: Secondary | ICD-10-CM | POA: Diagnosis not present

## 2019-05-09 DIAGNOSIS — E039 Hypothyroidism, unspecified: Secondary | ICD-10-CM | POA: Diagnosis not present

## 2019-05-09 DIAGNOSIS — E1122 Type 2 diabetes mellitus with diabetic chronic kidney disease: Secondary | ICD-10-CM | POA: Diagnosis not present

## 2019-05-09 DIAGNOSIS — M109 Gout, unspecified: Secondary | ICD-10-CM | POA: Diagnosis not present

## 2019-05-13 ENCOUNTER — Ambulatory Visit: Payer: Medicare Other | Admitting: Cardiology

## 2019-05-16 DIAGNOSIS — N189 Chronic kidney disease, unspecified: Secondary | ICD-10-CM | POA: Diagnosis not present

## 2019-05-16 DIAGNOSIS — I959 Hypotension, unspecified: Secondary | ICD-10-CM | POA: Diagnosis not present

## 2019-05-16 DIAGNOSIS — E7800 Pure hypercholesterolemia, unspecified: Secondary | ICD-10-CM | POA: Diagnosis not present

## 2019-05-16 DIAGNOSIS — I129 Hypertensive chronic kidney disease with stage 1 through stage 4 chronic kidney disease, or unspecified chronic kidney disease: Secondary | ICD-10-CM | POA: Diagnosis not present

## 2019-05-16 DIAGNOSIS — R0602 Shortness of breath: Secondary | ICD-10-CM | POA: Diagnosis not present

## 2019-05-16 DIAGNOSIS — I110 Hypertensive heart disease with heart failure: Secondary | ICD-10-CM | POA: Diagnosis not present

## 2019-05-16 DIAGNOSIS — R079 Chest pain, unspecified: Secondary | ICD-10-CM | POA: Diagnosis not present

## 2019-05-16 DIAGNOSIS — Z86711 Personal history of pulmonary embolism: Secondary | ICD-10-CM | POA: Diagnosis not present

## 2019-05-16 DIAGNOSIS — I509 Heart failure, unspecified: Secondary | ICD-10-CM | POA: Diagnosis not present

## 2019-05-16 DIAGNOSIS — I4891 Unspecified atrial fibrillation: Secondary | ICD-10-CM | POA: Diagnosis not present

## 2019-05-16 DIAGNOSIS — I517 Cardiomegaly: Secondary | ICD-10-CM | POA: Diagnosis not present

## 2019-05-16 DIAGNOSIS — J449 Chronic obstructive pulmonary disease, unspecified: Secondary | ICD-10-CM | POA: Diagnosis not present

## 2019-05-20 DIAGNOSIS — N183 Chronic kidney disease, stage 3 (moderate): Secondary | ICD-10-CM | POA: Diagnosis not present

## 2019-05-20 DIAGNOSIS — Z86718 Personal history of other venous thrombosis and embolism: Secondary | ICD-10-CM | POA: Diagnosis not present

## 2019-05-20 DIAGNOSIS — R7881 Bacteremia: Secondary | ICD-10-CM | POA: Diagnosis not present

## 2019-05-20 DIAGNOSIS — M1A09X0 Idiopathic chronic gout, multiple sites, without tophus (tophi): Secondary | ICD-10-CM | POA: Diagnosis not present

## 2019-05-20 DIAGNOSIS — M109 Gout, unspecified: Secondary | ICD-10-CM | POA: Diagnosis not present

## 2019-05-20 DIAGNOSIS — K219 Gastro-esophageal reflux disease without esophagitis: Secondary | ICD-10-CM | POA: Diagnosis not present

## 2019-05-20 DIAGNOSIS — I959 Hypotension, unspecified: Secondary | ICD-10-CM | POA: Diagnosis not present

## 2019-05-20 DIAGNOSIS — I5032 Chronic diastolic (congestive) heart failure: Secondary | ICD-10-CM | POA: Diagnosis not present

## 2019-05-20 DIAGNOSIS — Z86711 Personal history of pulmonary embolism: Secondary | ICD-10-CM | POA: Diagnosis not present

## 2019-05-20 DIAGNOSIS — E039 Hypothyroidism, unspecified: Secondary | ICD-10-CM | POA: Diagnosis not present

## 2019-05-20 DIAGNOSIS — R8271 Bacteriuria: Secondary | ICD-10-CM | POA: Diagnosis not present

## 2019-05-20 DIAGNOSIS — E1165 Type 2 diabetes mellitus with hyperglycemia: Secondary | ICD-10-CM | POA: Diagnosis not present

## 2019-05-20 DIAGNOSIS — Z79899 Other long term (current) drug therapy: Secondary | ICD-10-CM | POA: Diagnosis not present

## 2019-05-20 DIAGNOSIS — J449 Chronic obstructive pulmonary disease, unspecified: Secondary | ICD-10-CM | POA: Diagnosis not present

## 2019-05-20 DIAGNOSIS — Z7901 Long term (current) use of anticoagulants: Secondary | ICD-10-CM | POA: Diagnosis not present

## 2019-05-20 DIAGNOSIS — B9689 Other specified bacterial agents as the cause of diseases classified elsewhere: Secondary | ICD-10-CM | POA: Diagnosis not present

## 2019-05-20 DIAGNOSIS — D6859 Other primary thrombophilia: Secondary | ICD-10-CM | POA: Diagnosis not present

## 2019-05-20 DIAGNOSIS — R0602 Shortness of breath: Secondary | ICD-10-CM | POA: Diagnosis not present

## 2019-05-20 DIAGNOSIS — H9190 Unspecified hearing loss, unspecified ear: Secondary | ICD-10-CM | POA: Diagnosis not present

## 2019-05-20 DIAGNOSIS — I130 Hypertensive heart and chronic kidney disease with heart failure and stage 1 through stage 4 chronic kidney disease, or unspecified chronic kidney disease: Secondary | ICD-10-CM | POA: Diagnosis not present

## 2019-05-20 DIAGNOSIS — N179 Acute kidney failure, unspecified: Secondary | ICD-10-CM | POA: Diagnosis not present

## 2019-05-20 DIAGNOSIS — Z03818 Encounter for observation for suspected exposure to other biological agents ruled out: Secondary | ICD-10-CM | POA: Diagnosis not present

## 2019-05-20 DIAGNOSIS — E1122 Type 2 diabetes mellitus with diabetic chronic kidney disease: Secondary | ICD-10-CM | POA: Diagnosis not present

## 2019-05-21 DIAGNOSIS — E1165 Type 2 diabetes mellitus with hyperglycemia: Secondary | ICD-10-CM | POA: Diagnosis not present

## 2019-05-21 DIAGNOSIS — I5032 Chronic diastolic (congestive) heart failure: Secondary | ICD-10-CM | POA: Diagnosis not present

## 2019-05-21 DIAGNOSIS — R7881 Bacteremia: Secondary | ICD-10-CM | POA: Diagnosis not present

## 2019-05-21 DIAGNOSIS — N179 Acute kidney failure, unspecified: Secondary | ICD-10-CM | POA: Diagnosis not present

## 2019-05-22 DIAGNOSIS — E1165 Type 2 diabetes mellitus with hyperglycemia: Secondary | ICD-10-CM | POA: Diagnosis not present

## 2019-05-22 DIAGNOSIS — I5032 Chronic diastolic (congestive) heart failure: Secondary | ICD-10-CM | POA: Diagnosis not present

## 2019-05-22 DIAGNOSIS — N179 Acute kidney failure, unspecified: Secondary | ICD-10-CM | POA: Diagnosis not present

## 2019-05-22 DIAGNOSIS — R7881 Bacteremia: Secondary | ICD-10-CM | POA: Diagnosis not present

## 2019-05-23 ENCOUNTER — Ambulatory Visit: Payer: Medicare Other | Admitting: Cardiology

## 2019-05-23 DIAGNOSIS — R7881 Bacteremia: Secondary | ICD-10-CM | POA: Diagnosis not present

## 2019-05-23 DIAGNOSIS — I5032 Chronic diastolic (congestive) heart failure: Secondary | ICD-10-CM | POA: Diagnosis not present

## 2019-05-23 DIAGNOSIS — E1165 Type 2 diabetes mellitus with hyperglycemia: Secondary | ICD-10-CM | POA: Diagnosis not present

## 2019-05-23 DIAGNOSIS — N179 Acute kidney failure, unspecified: Secondary | ICD-10-CM | POA: Diagnosis not present

## 2019-05-28 DIAGNOSIS — N184 Chronic kidney disease, stage 4 (severe): Secondary | ICD-10-CM | POA: Diagnosis not present

## 2019-05-28 DIAGNOSIS — Z7689 Persons encountering health services in other specified circumstances: Secondary | ICD-10-CM | POA: Diagnosis not present

## 2019-05-28 DIAGNOSIS — E039 Hypothyroidism, unspecified: Secondary | ICD-10-CM | POA: Diagnosis not present

## 2019-05-28 DIAGNOSIS — I4891 Unspecified atrial fibrillation: Secondary | ICD-10-CM | POA: Diagnosis not present

## 2019-05-28 DIAGNOSIS — R7881 Bacteremia: Secondary | ICD-10-CM | POA: Diagnosis not present

## 2019-05-28 DIAGNOSIS — I959 Hypotension, unspecified: Secondary | ICD-10-CM | POA: Diagnosis not present

## 2019-05-30 DIAGNOSIS — I129 Hypertensive chronic kidney disease with stage 1 through stage 4 chronic kidney disease, or unspecified chronic kidney disease: Secondary | ICD-10-CM | POA: Diagnosis not present

## 2019-05-30 DIAGNOSIS — N2581 Secondary hyperparathyroidism of renal origin: Secondary | ICD-10-CM | POA: Diagnosis not present

## 2019-05-30 DIAGNOSIS — N184 Chronic kidney disease, stage 4 (severe): Secondary | ICD-10-CM | POA: Diagnosis not present

## 2019-06-23 IMAGING — RF ERCP
1 series · 14 of 14 positions shown · non-contrast
Comparison: None.

CLINICAL DATA: Choledocholithiasis

EXAM:
ERCP
TECHNIQUE: Multiple spot images obtained with the fluoroscopic device and
submitted for interpretation post-procedure.
FLUOROSCOPY TIME:  Fluoroscopy Time:  4 minutes and 39 seconds
Radiation Exposure Index (if provided by the fluoroscopic device):
Number of Acquired Spot Images: 0

[Series 1: unknown protocol · 0.20mm/px · 14 of 14 slices shown]
[im 1/14]
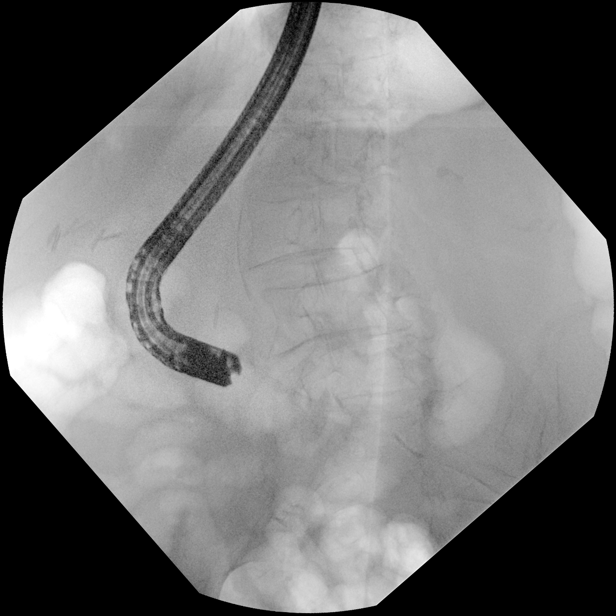
[im 2/14]
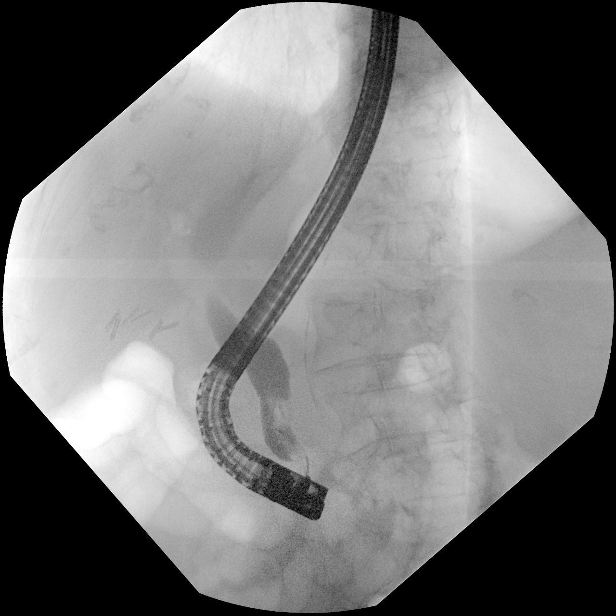
[im 3/14]
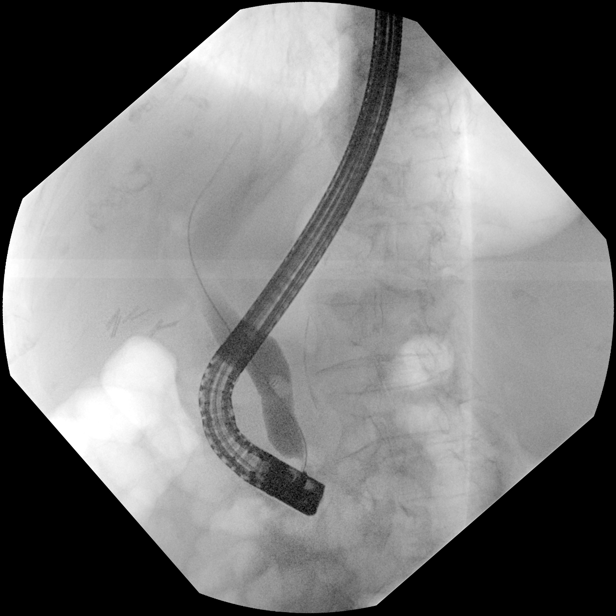
[im 4/14]
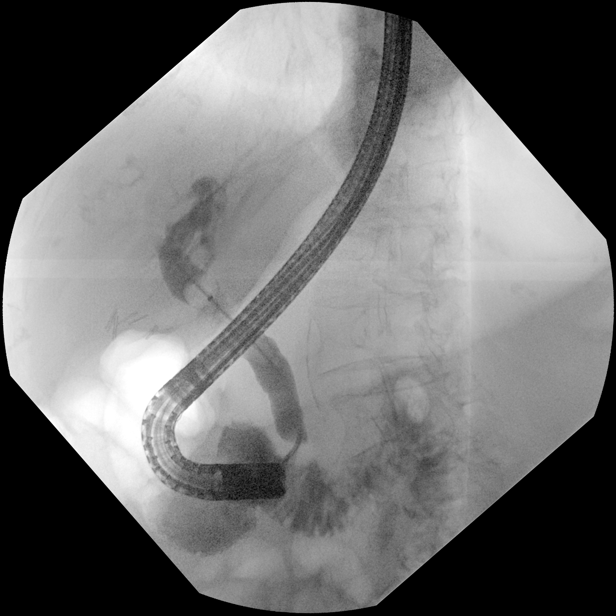
[im 5/14]
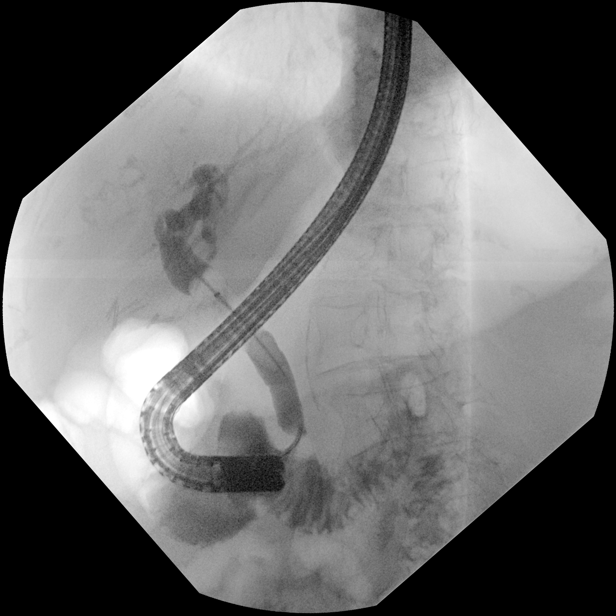
[im 6/14]
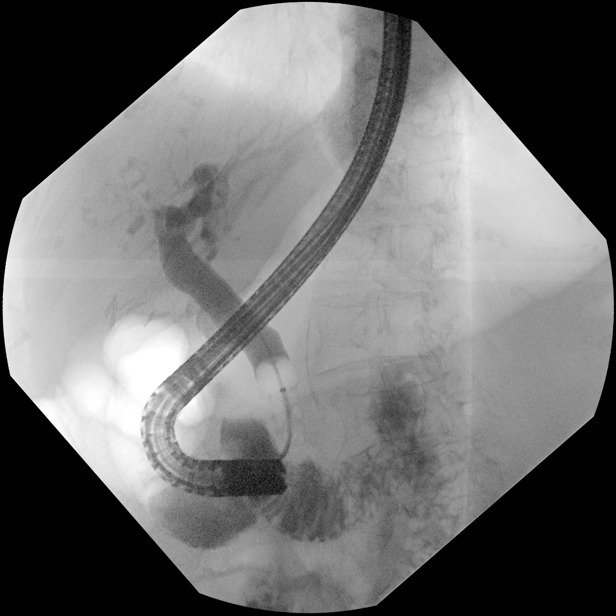
[im 7/14]
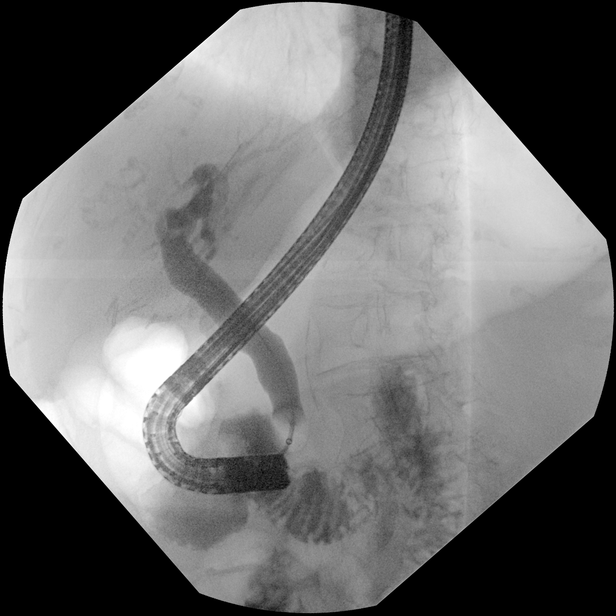
[im 8/14]
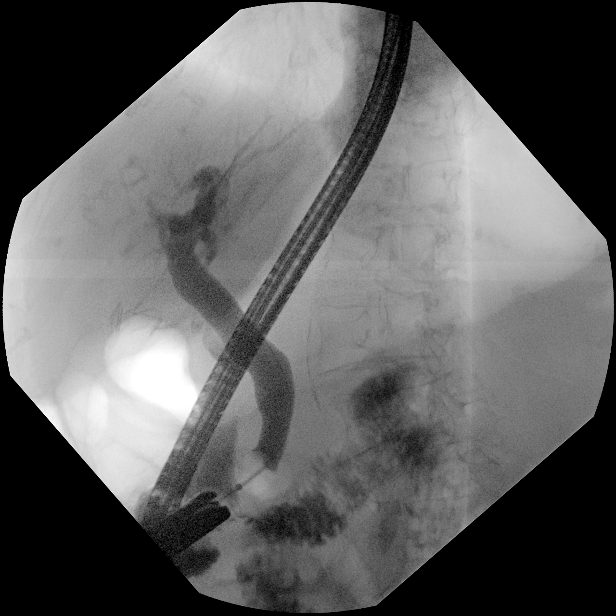
[im 9/14]
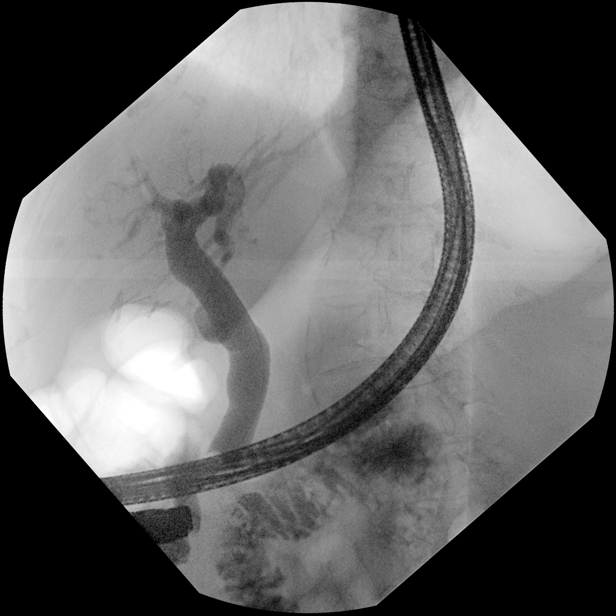
[im 10/14]
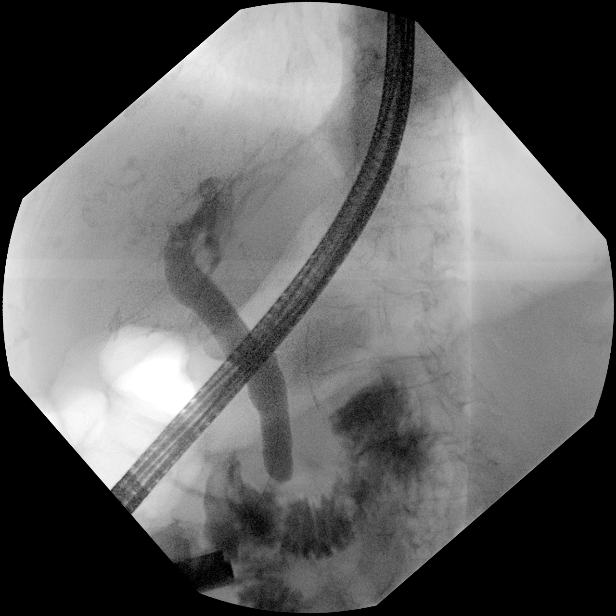
[im 11/14]
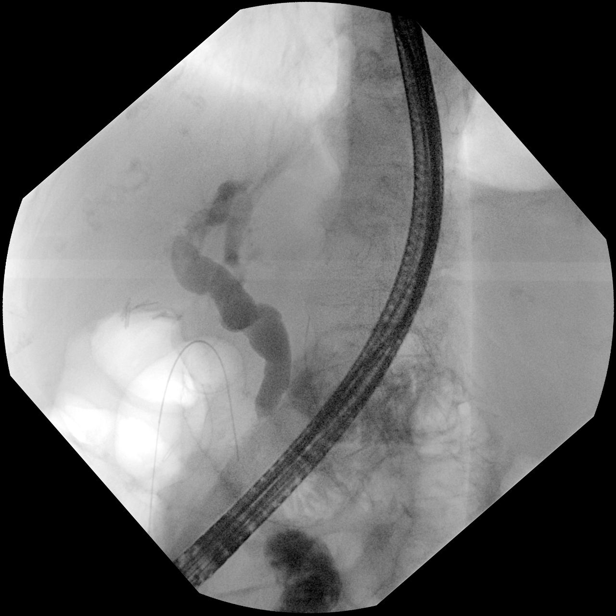
[im 12/14]
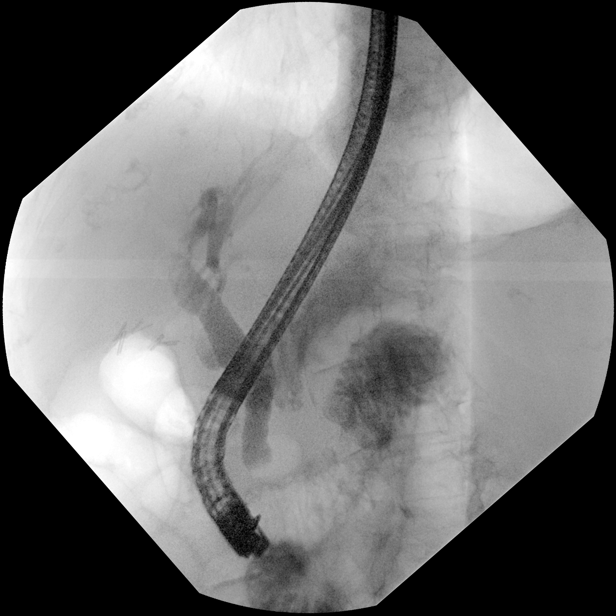
[im 13/14]
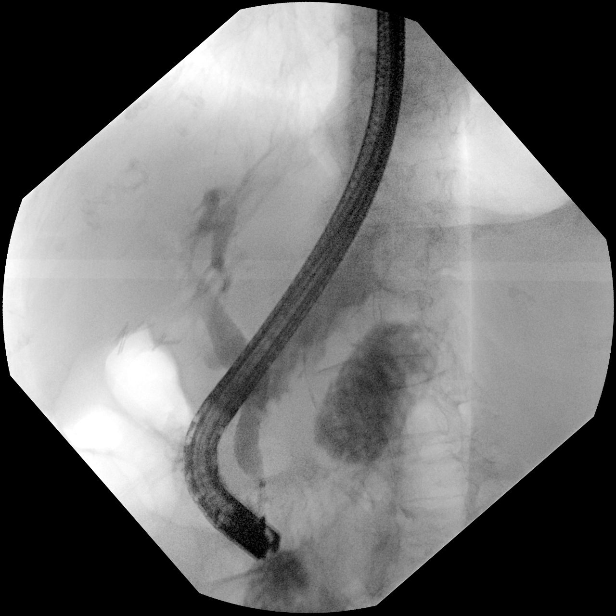
[im 14/14]
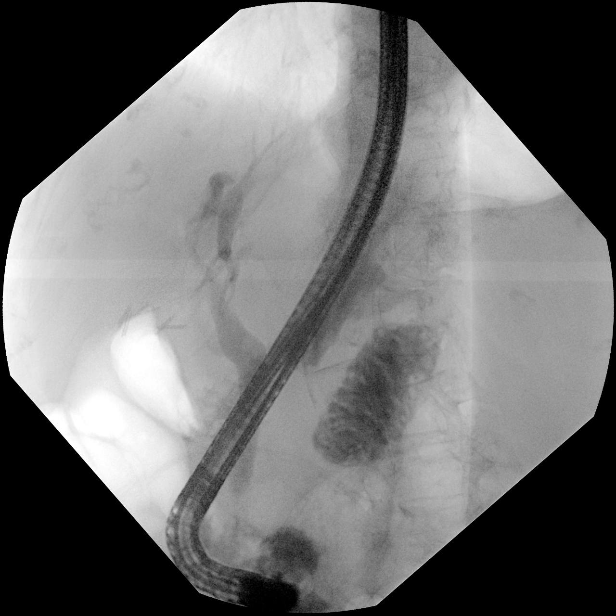

[14 of 14 positions shown; findings below may reference images not displayed]

FINDINGS: Imaging demonstrates cannulation of the common bile duct. Filling
defects in the common bile duct are noted. Balloon stone retrieval
is noted.
IMPRESSION: See above.

These images were submitted for radiologic interpretation only.
Please see the procedural report for the amount of contrast and the
fluoroscopy time utilized.

## 2019-06-23 IMAGING — US ULTRASOUND ABDOMEN COMPLETE
1 series · 13 of 25 positions shown · non-contrast
Comparison: CT abdomen/pelvis dated 02/15/2019
COMPARISON: CT abdomen/pelvis dated 02/15/2019

Addendum:
CLINICAL DATA: Elevated LFTs, epigastric pain, fever

EXAM:
ABDOMEN ULTRASOUND COMPLETE

[Series 1: ultrasound abdomen complete · 13 of 74 slices shown]
[im 1/74]
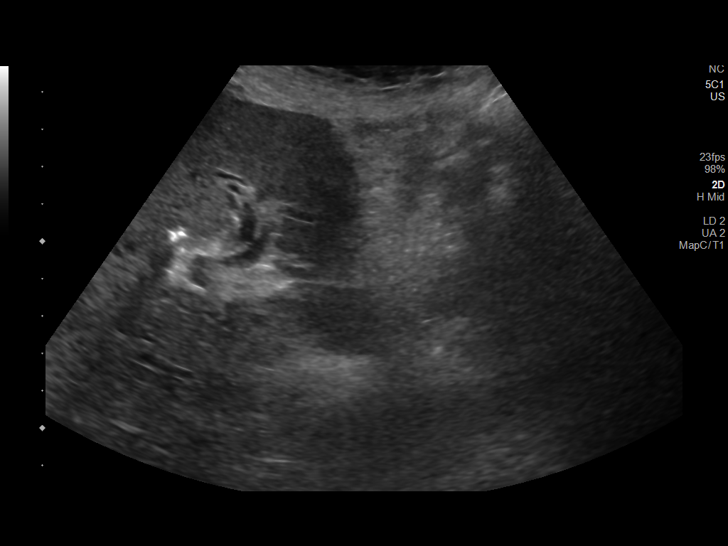
[im 7/74]
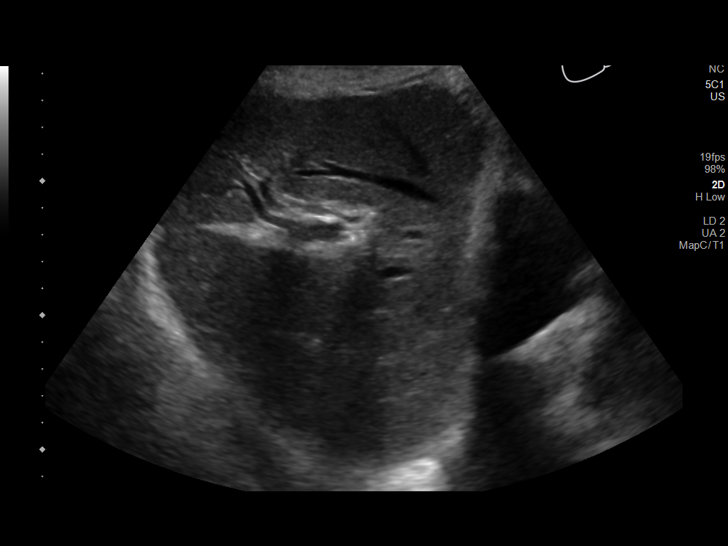
[im 13/74]
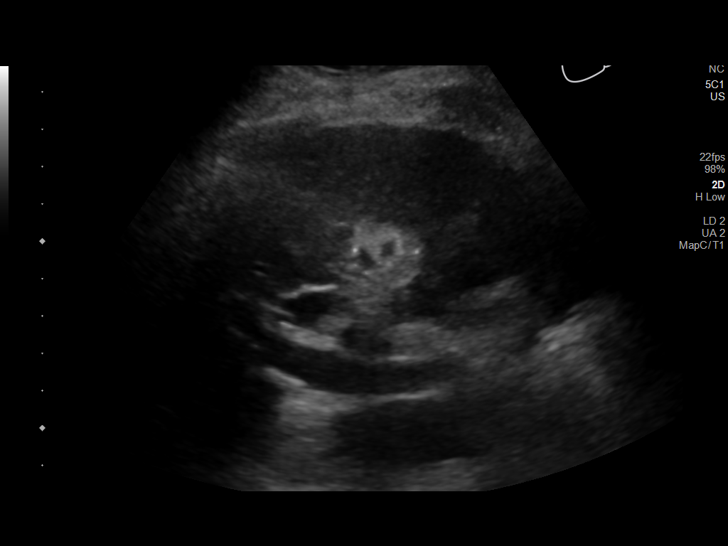
[im 19/74]
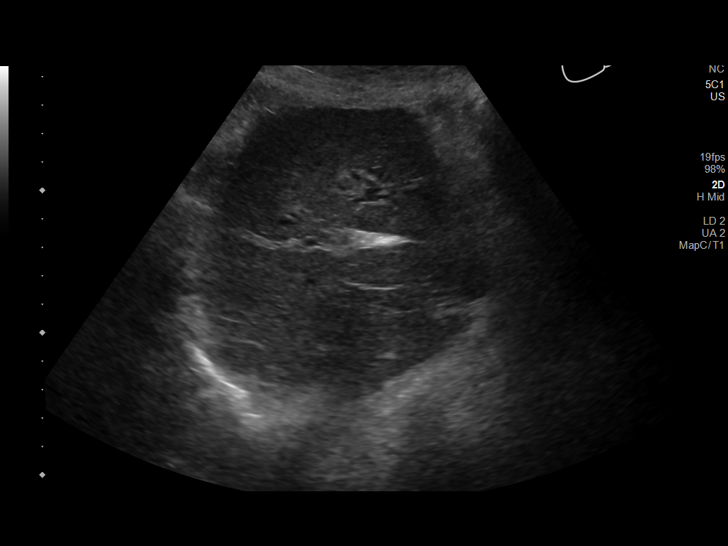
[im 25/74]
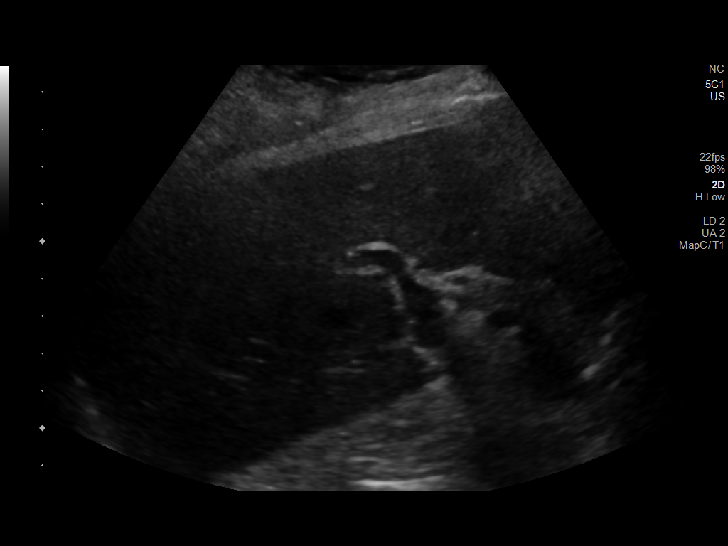
[im 31/74]
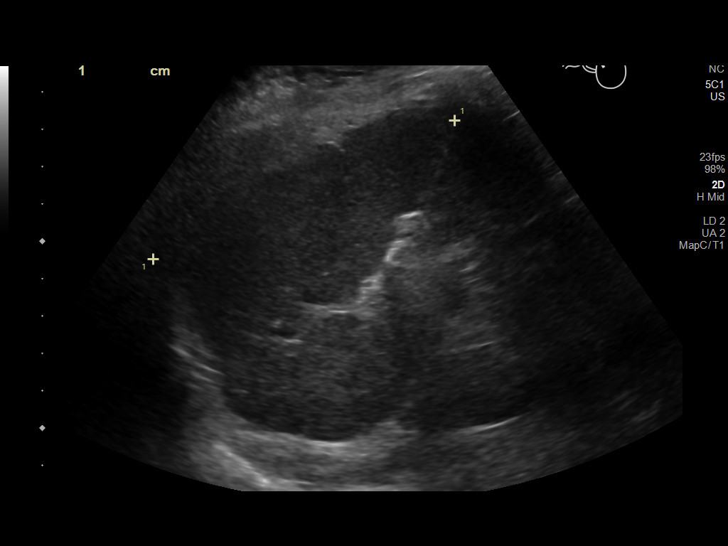
[im 37/74]
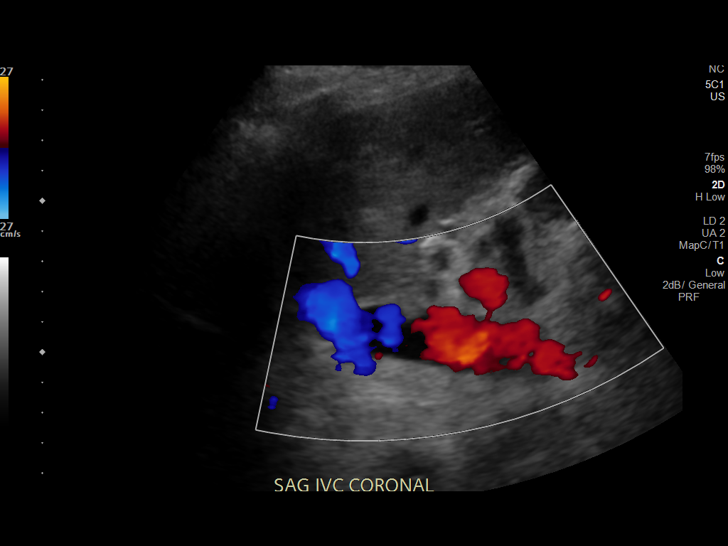
[im 43/74]
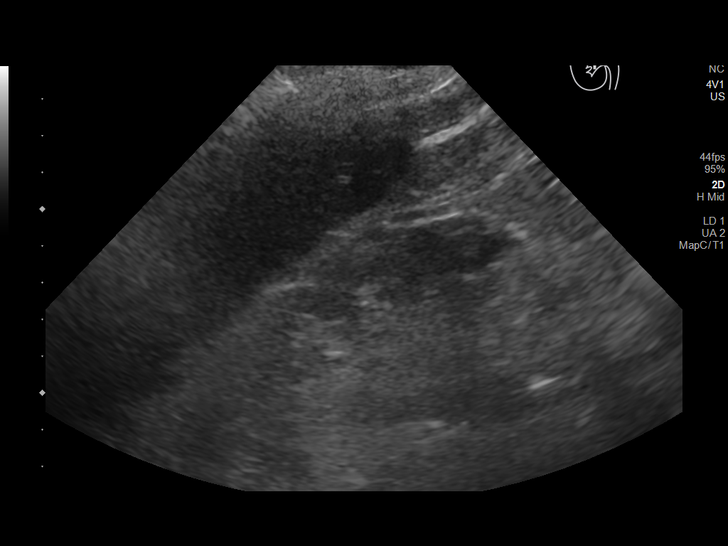
[im 49/74]
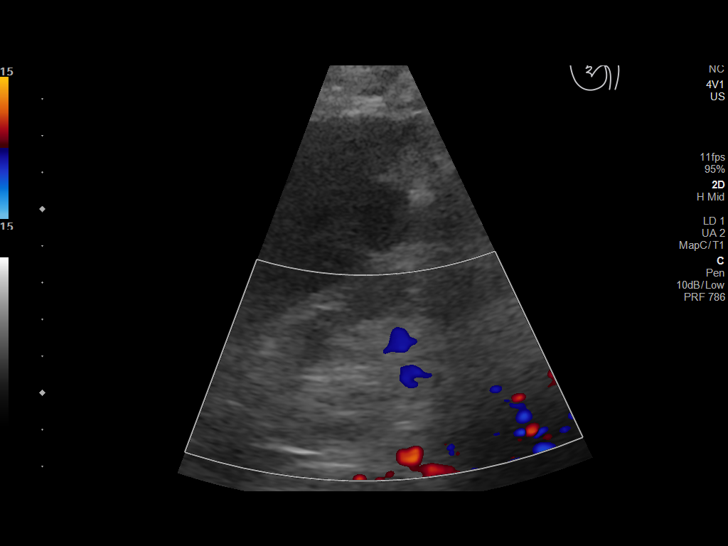
[im 55/74]
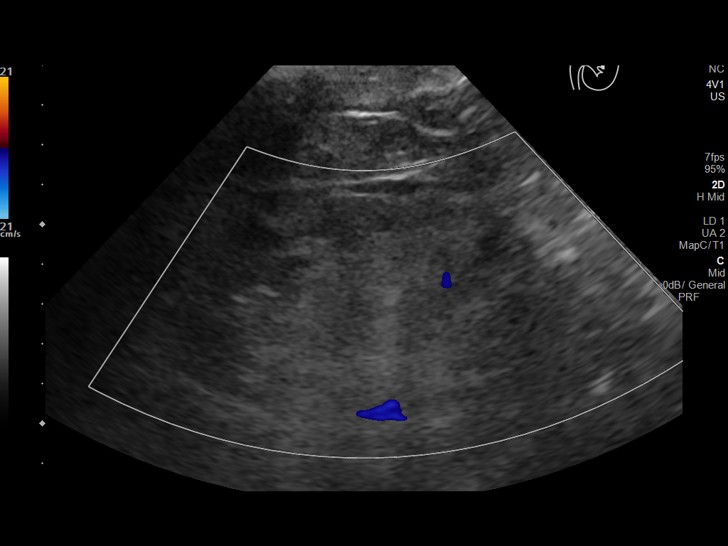
[im 61/74]
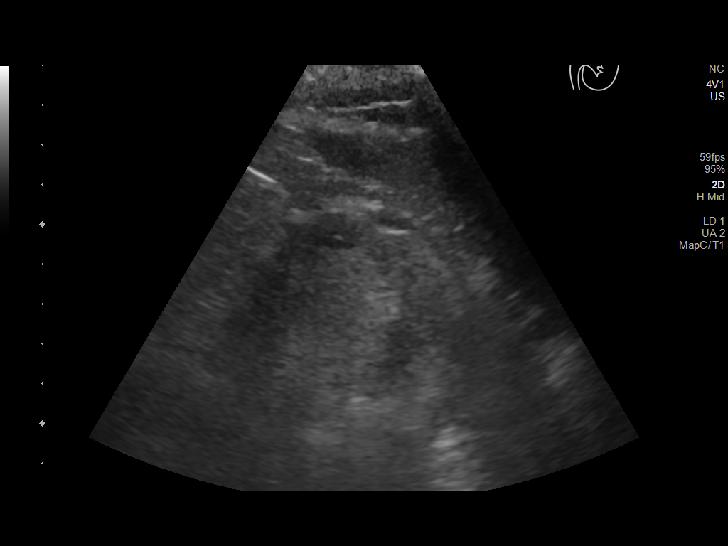
[im 67/74]
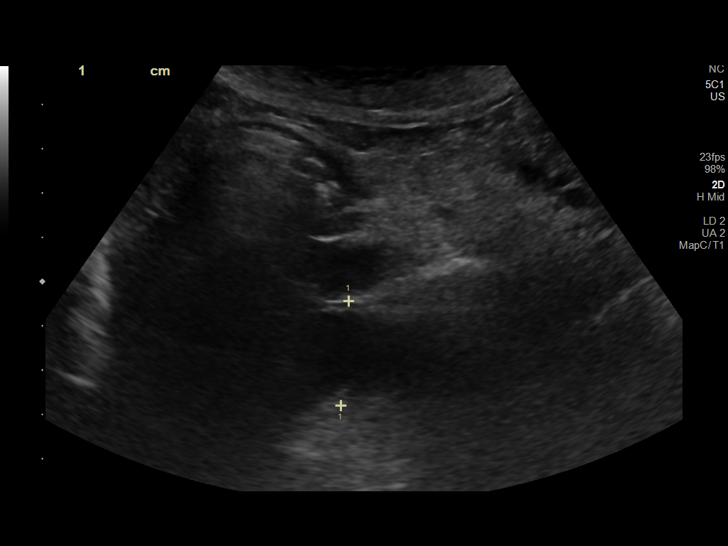
[im 74/74]
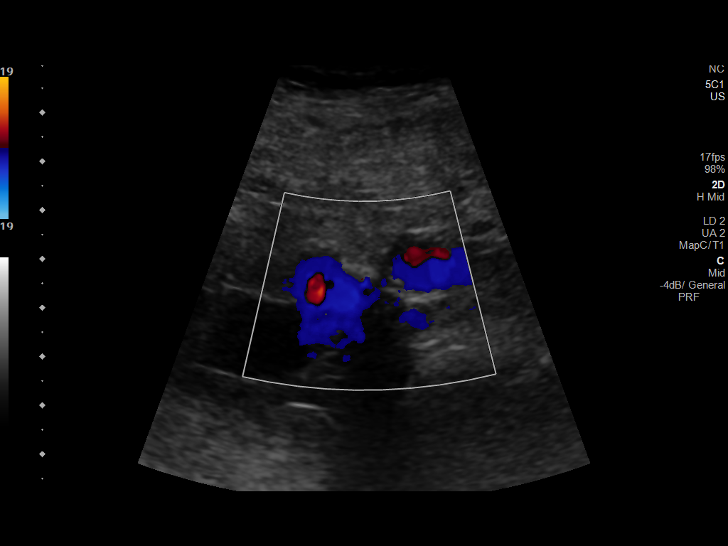

[13 of 25 positions shown; findings below may reference images not displayed]

FINDINGS: Gallbladder: Surgically absent.

Common bile duct: Diameter: 3 mm

Liver: Mildly nodular hepatic contour. No focal hepatic lesion is
seen. Portal vein is patent on color Doppler imaging with normal
direction of blood flow towards the liver.

IVC: No abnormality visualized.

Pancreas: Visualized portion unremarkable.

Spleen: Size and appearance within normal limits.

Right Kidney: Length: 9.4 cm. Echogenic renal parenchyma, suggesting
medical renal disease. No mass or hydronephrosis.

Left Kidney: Length: 9.0 cm. Echogenic renal parenchyma, suggesting
medical renal disease. No mass or hydronephrosis.

Abdominal aorta: No aneurysm visualized.

Other findings: None.
IMPRESSION: Mildly nodular hepatic contour, raising the possibility of
cirrhosis. No focal hepatic lesion is seen.

Echogenic renal parenchyma, suggesting medical renal disease. No
hydronephrosis.

ADDENDUM:
This addendum is given after review of the initially dictated report
and comparison CT scan in consultation with Dr. Kulthum Sk. Based on
review, the patient has extensive pneumobilia. A 0.8 cm in diameter
stone is seen in the distal common bile duct and better visualized
on the prior CT.

*** End of Addendum ***
FINDINGS: Gallbladder: Surgically absent.

Common bile duct: Diameter: 3 mm

Liver: Mildly nodular hepatic contour. No focal hepatic lesion is
seen. Portal vein is patent on color Doppler imaging with normal
direction of blood flow towards the liver.

IVC: No abnormality visualized.

Pancreas: Visualized portion unremarkable.

Spleen: Size and appearance within normal limits.

Right Kidney: Length: 9.4 cm. Echogenic renal parenchyma, suggesting
medical renal disease. No mass or hydronephrosis.

Left Kidney: Length: 9.0 cm. Echogenic renal parenchyma, suggesting
medical renal disease. No mass or hydronephrosis.

Abdominal aorta: No aneurysm visualized.

Other findings: None.
IMPRESSION: Mildly nodular hepatic contour, raising the possibility of
cirrhosis. No focal hepatic lesion is seen.

Echogenic renal parenchyma, suggesting medical renal disease. No
hydronephrosis.

## 2019-07-14 ENCOUNTER — Ambulatory Visit: Payer: Medicare Other | Admitting: Cardiology

## 2019-07-15 ENCOUNTER — Other Ambulatory Visit: Payer: Self-pay

## 2019-07-15 DIAGNOSIS — Z9181 History of falling: Secondary | ICD-10-CM | POA: Diagnosis not present

## 2019-07-15 DIAGNOSIS — Z139 Encounter for screening, unspecified: Secondary | ICD-10-CM | POA: Diagnosis not present

## 2019-07-15 DIAGNOSIS — E785 Hyperlipidemia, unspecified: Secondary | ICD-10-CM | POA: Diagnosis not present

## 2019-07-15 DIAGNOSIS — Z0000 Encounter for general adult medical examination without abnormal findings: Secondary | ICD-10-CM | POA: Diagnosis not present

## 2019-07-15 MED ORDER — FUROSEMIDE 40 MG PO TABS: 40.0000 mg | ORAL_TABLET | Freq: Two times a day (BID) | ORAL | 0 refills | Status: DC

## 2019-07-28 DIAGNOSIS — R5381 Other malaise: Secondary | ICD-10-CM | POA: Diagnosis not present

## 2019-07-28 DIAGNOSIS — R197 Diarrhea, unspecified: Secondary | ICD-10-CM | POA: Diagnosis not present

## 2019-07-28 DIAGNOSIS — R531 Weakness: Secondary | ICD-10-CM | POA: Diagnosis not present

## 2019-07-28 DIAGNOSIS — U071 COVID-19: Secondary | ICD-10-CM | POA: Diagnosis not present

## 2019-07-28 DIAGNOSIS — J439 Emphysema, unspecified: Secondary | ICD-10-CM | POA: Diagnosis not present

## 2019-07-28 DIAGNOSIS — R110 Nausea: Secondary | ICD-10-CM | POA: Diagnosis not present

## 2019-07-29 ENCOUNTER — Other Ambulatory Visit: Payer: Self-pay

## 2019-07-29 ENCOUNTER — Emergency Department (HOSPITAL_COMMUNITY)
Admission: EM | Admit: 2019-07-29 | Discharge: 2019-07-29 | Disposition: A | Discharge status: Home / Self Care | Payer: Medicare Other | Attending: Emergency Medicine | Admitting: Emergency Medicine

## 2019-07-29 ENCOUNTER — Emergency Department (HOSPITAL_COMMUNITY): Payer: Medicare Other

## 2019-07-29 ENCOUNTER — Encounter (HOSPITAL_COMMUNITY): Payer: Self-pay | Admitting: Emergency Medicine

## 2019-07-29 DIAGNOSIS — Z743 Need for continuous supervision: Secondary | ICD-10-CM | POA: Diagnosis not present

## 2019-07-29 DIAGNOSIS — Z79899 Other long term (current) drug therapy: Secondary | ICD-10-CM | POA: Diagnosis not present

## 2019-07-29 DIAGNOSIS — J439 Emphysema, unspecified: Secondary | ICD-10-CM | POA: Diagnosis not present

## 2019-07-29 DIAGNOSIS — R197 Diarrhea, unspecified: Secondary | ICD-10-CM | POA: Diagnosis not present

## 2019-07-29 DIAGNOSIS — I130 Hypertensive heart and chronic kidney disease with heart failure and stage 1 through stage 4 chronic kidney disease, or unspecified chronic kidney disease: Secondary | ICD-10-CM | POA: Insufficient documentation

## 2019-07-29 DIAGNOSIS — U071 COVID-19: Secondary | ICD-10-CM | POA: Insufficient documentation

## 2019-07-29 DIAGNOSIS — J449 Chronic obstructive pulmonary disease, unspecified: Secondary | ICD-10-CM | POA: Insufficient documentation

## 2019-07-29 DIAGNOSIS — I4891 Unspecified atrial fibrillation: Secondary | ICD-10-CM | POA: Diagnosis not present

## 2019-07-29 DIAGNOSIS — I5032 Chronic diastolic (congestive) heart failure: Secondary | ICD-10-CM | POA: Insufficient documentation

## 2019-07-29 DIAGNOSIS — E1122 Type 2 diabetes mellitus with diabetic chronic kidney disease: Secondary | ICD-10-CM | POA: Insufficient documentation

## 2019-07-29 DIAGNOSIS — N183 Chronic kidney disease, stage 3 unspecified: Secondary | ICD-10-CM | POA: Diagnosis not present

## 2019-07-29 DIAGNOSIS — Z7901 Long term (current) use of anticoagulants: Secondary | ICD-10-CM | POA: Insufficient documentation

## 2019-07-29 LAB — CBC WITH DIFFERENTIAL/PLATELET
Abs Immature Granulocytes: 0.02 10*3/uL (ref 0.00–0.07)
Basophils Absolute: 0 10*3/uL (ref 0.0–0.1)
Basophils Relative: 0 %
Eosinophils Absolute: 0 10*3/uL (ref 0.0–0.5)
Eosinophils Relative: 0 %
HCT: 43.2 % (ref 36.0–46.0)
Hemoglobin: 13.4 g/dL (ref 12.0–15.0)
Immature Granulocytes: 0 %
Lymphocytes Relative: 10 %
Lymphs Abs: 0.7 10*3/uL (ref 0.7–4.0)
MCH: 28.8 pg (ref 26.0–34.0)
MCHC: 31 g/dL (ref 30.0–36.0)
MCV: 92.7 fL (ref 80.0–100.0)
Monocytes Absolute: 0.4 10*3/uL (ref 0.1–1.0)
Monocytes Relative: 6 %
Neutro Abs: 5.9 10*3/uL (ref 1.7–7.7)
Neutrophils Relative %: 84 %
Platelets: 181 10*3/uL (ref 150–400)
RBC: 4.66 MIL/uL (ref 3.87–5.11)
RDW: 14.4 % (ref 11.5–15.5)
WBC: 7 10*3/uL (ref 4.0–10.5)
nRBC: 0 % (ref 0.0–0.2)

## 2019-07-29 LAB — COMPREHENSIVE METABOLIC PANEL
ALT: 13 U/L (ref 0–44)
AST: 30 U/L (ref 15–41)
Albumin: 2.9 g/dL — ABNORMAL LOW (ref 3.5–5.0)
Alkaline Phosphatase: 76 U/L (ref 38–126)
Anion gap: 12 (ref 5–15)
BUN: 57 mg/dL — ABNORMAL HIGH (ref 8–23)
CO2: 17 mmol/L — ABNORMAL LOW (ref 22–32)
Calcium: 8.6 mg/dL — ABNORMAL LOW (ref 8.9–10.3)
Chloride: 105 mmol/L (ref 98–111)
Creatinine, Ser: 2.53 mg/dL — ABNORMAL HIGH (ref 0.44–1.00)
GFR calc Af Amer: 18 mL/min — ABNORMAL LOW (ref >60)
GFR calc non Af Amer: 15 mL/min — ABNORMAL LOW (ref >60)
Glucose, Bld: 119 mg/dL — ABNORMAL HIGH (ref 70–99)
Potassium: 5.1 mmol/L (ref 3.5–5.1)
Sodium: 134 mmol/L — ABNORMAL LOW (ref 135–145)
Total Bilirubin: 1.3 mg/dL — ABNORMAL HIGH (ref 0.3–1.2)
Total Protein: 6 g/dL — ABNORMAL LOW (ref 6.5–8.1)

## 2019-07-29 LAB — BRAIN NATRIURETIC PEPTIDE: B Natriuretic Peptide: 360.3 pg/mL — ABNORMAL HIGH (ref 0.0–100.0)

## 2019-07-29 LAB — CBG MONITORING, ED: Glucose-Capillary: 111 mg/dL — ABNORMAL HIGH (ref 70–99)

## 2019-07-29 LAB — SARS CORONAVIRUS 2 (TAT 6-24 HRS): SARS Coronavirus 2: POSITIVE — AB

## 2019-07-29 NOTE — Discharge Instructions (Addendum)
Your laboratory results today were within normal limits.  Your oxygen level was between 97-100% during your stay in the ED.  Please continue to home quarantine for the next two weeks.  If you experience any worsening symptoms such a shortness of breath, chest pain please return to the ED.

## 2019-07-29 NOTE — ED Notes (Signed)
PT had a BM and was cleaned by this tech before placing her in a wheelchair and wheeling her out to the lobby. RN Doren Custard witness. Sort RN made aware of PT location and Precautions outside to help her into car if needed. PT verbalized understanding of instructions.

## 2019-07-29 NOTE — ED Triage Notes (Signed)
Pt arrives to ED from home with complaints of having the COVID19 virus and diarrhea. EMS reports that patient was at [facility] this week and was offered to be admitted to [facility] which she and family refused. Patient does not no why she is here and does not feel bad.

## 2019-07-29 NOTE — ED Provider Notes (Addendum)
I assumed care of the patient at 1500.  The patient had been seen by the team her earlier today and had been up for discharge.  The case has been discussed with the family and they agreed to take her home.  Some time between then and 1700 the family decided that the patient should be admitted to the hospital.  They called the nurse multiple times and eventually he had me call them directly to discuss the case with them. The patient had been seen yesterday at [facility], diagnosed with the novel coronavirus but refused to be admitted to [facility].  The PCP had evaluated the patient's lab work from [place] and urged the family to take the patient back here so that she could be hospitalized.  I had the nursing staff get the patient up and ambulate the patient around the room which she was able to do with very minimal assistance for someone who requires a walker at home.  At no time did she become especially tachypneic or require oxygen.  I independently reviewed her lab work that looks pretty similar to when she was last seen in our system about 5 months ago.  I discussed this with the family at length, I offered to discuss case with the hospitalist for possible admission.  I discussed my caution on putting a 85 year old lady into the hospital that did not necessarily need to based on her lab work or her vital signs.  The family made some very negative comments in my direction that I was sendig the patient home to die.  I again offered to call the hospitalist but they continued to tell me that I was just trying to kill their grandmother.  They are coming in to take the patient home.       Deno Etienne, DO 07/29/19 1718

## 2019-07-29 NOTE — ED Provider Notes (Addendum)
[facility] EMERGENCY DEPARTMENT Provider Note   CSN: [id number] Arrival date & time: 07/29/19  1214     History   Chief Complaint Chief Complaint  Patient presents with  . COVID  . Diarrhea    HPI Felicia Guzman is a 85 y.o. female.     85 y.o female with a PMH of HTN, COPD, CHF, Afib presents to the ED via EMS with a chief complaint of covid 19 infection. Per EMS report patient was diagnosed with Covid 63 yesterday, she was seen at [facility] and advised to be sent over to [facility] however patient deferred at the time.  Patient reports no fevers, chest pain, shortness of breath.  Will attempt to call family member in order to obtain collateral information. She arrived in the ED with stable vital signs, according to EMS report oxygenation has stayed at 97% on room air without any tachypnea or hypoxia.  The history is provided by the patient and medical records.    Past Medical History:  Diagnosis Date  . Anemia, chronic disease 06/06/2017  . Arthritis   . Cataract   . CHF (congestive heart failure) ([place])   . Choledocholithiasis 06/06/2017  . Chronic anticoagulation 05/03/2015  . Chronic atrial fibrillation ([place]) 05/03/2015   Overview:  CHADS2 vasc =6. She refuses an [place]  . Chronic diastolic heart failure ([place]) 05/03/2015  . Chronic kidney disease   . CKD (chronic kidney disease) stage 3, GFR 30-59 ml/min 06/06/2017  . Clotting disorder ([place])   . COPD (chronic obstructive pulmonary disease) ([place]) 06/06/2017  . Diabetes mellitus without complication ([place])   . Frequent falls 06/06/2017  . Gout 06/06/2017  . Hyperlipidemia 06/06/2017  . Hypertension   . Hypertensive heart disease with heart failure ([place]) 05/03/2015  . Osteoporosis 06/06/2017  . PE (pulmonary thromboembolism) ([place]) 06/06/2017  . Primary osteoarthritis of right knee 10/25/2015  . Thyroid disease   . Type 2 diabetes, controlled, with neuropathy ([place]) 06/06/2017  . Vitamin D deficiency  06/06/2017    Patient Active Problem List   Diagnosis Date Noted  . Ascending cholangitis 02/16/2019  . Anemia, chronic disease 06/06/2017  . Choledocholithiasis 06/06/2017  . CKD (chronic kidney disease) stage 3, GFR 30-59 ml/min (HCC) 06/06/2017  . COPD (chronic obstructive pulmonary disease) ([place]) 06/06/2017  . Frequent falls 06/06/2017  . Gout 06/06/2017  . Hyperlipidemia 06/06/2017  . Essential hypertension 06/06/2017  . Osteoporosis 06/06/2017  . Pulmonary embolism ([place]) 06/06/2017  . Type 2 diabetes, controlled, with neuropathy ([place]) 06/06/2017  . Vitamin D deficiency 06/06/2017  . Thyroid disease   . Diabetes mellitus without complication ([place])   . Clotting disorder ([place])   . Chronic kidney disease   . CHF (congestive heart failure) ([place])   . Cataract   . Arthritis   . Primary osteoarthritis of right knee 10/25/2015  . Chronic anticoagulation 05/03/2015  . Chronic atrial fibrillation 05/03/2015  . Chronic diastolic heart failure ([place]) 05/03/2015  . Hypertensive heart disease with heart failure ([place]) 05/03/2015    Past Surgical History:  Procedure Laterality Date  . ABDOMINAL HYSTERECTOMY    . APPENDECTOMY    . CHOLECYSTECTOMY    . ERCP    . ERCP N/A 02/16/2019   Procedure: ENDOSCOPIC RETROGRADE CHOLANGIOPANCREATOGRAPHY (ERCP);  Surgeon: Irene Shipper, MD;  Location: [facility] ENDOSCOPY;  Service: Endoscopy;  Laterality: N/A;  . EYE SURGERY    . REMOVAL OF STONES  02/16/2019   Procedure: REMOVAL OF STONES;  Surgeon: Scarlette Shorts  N, MD;  Location: [facility]OSCOPY;  Service: Endoscopy;;  . TOTAL KNEE ARTHROPLASTY       OB History   No obstetric history on file.      Home Medications    Prior to Admission medications   Medication Sig Start Date End Date Taking? Authorizing Provider  acetaminophen (TYLENOL) 500 MG tablet Take 1,000 mg by mouth every 6 (six) hours as needed for mild pain (left knee pain).    [provider]  Apremilast 30 MG TABS Take 30 mg by  mouth at bedtime.     [provider]  brimonidine (ALPHAGAN) 0.2 % ophthalmic solution Place 1 drop into the left eye 2 (two) times a day.    [provider]  calcitRIOL (ROCALTROL) 0.25 MCG capsule Take 0.25 mcg by mouth every Monday, Wednesday, and Friday.     [provider]  dorzolamide-timolol (COSOPT) 22.3-6.8 MG/ML ophthalmic solution Place 1 drop into the left eye 2 (two) times daily.     [provider]  ELIQUIS 2.5 MG TABS tablet Take 2.5 mg by mouth 2 (two) times daily. 05/23/18   [provider]  famotidine (PEPCID) 20 MG tablet Take 1 tablet (20 mg total) by mouth 2 (two) times daily. 02/17/19 02/17/20  Barton Dubois, MD  furosemide (LASIX) 40 MG tablet Take 1 tablet (40 mg total) by mouth 2 (two) times daily. Take 1 extra tablet every morning on Monday, Wednesday, Friday. 07/15/19   Richardo Priest, MD  Lancets MISC by Does not apply route.    [provider]  levothyroxine (SYNTHROID, LEVOTHROID) 75 MCG tablet Take 75 mcg by mouth daily before breakfast.     [provider]  metoprolol tartrate (LOPRESSOR) 25 MG tablet Take 1 tablet (25 mg total) by mouth daily. 12/27/18   Richardo Priest, MD  montelukast (SINGULAIR) 10 MG tablet Take 10 mg by mouth daily.  10/18/18   [provider]  ondansetron (ZOFRAN ODT) 8 MG disintegrating tablet Take 1 tablet (8 mg total) by mouth every 8 (eight) hours as needed for nausea or vomiting. Patient not taking: Reported on 02/24/2019 02/17/19   Barton Dubois, MD  ULORIC 40 MG tablet Take 40 mg by mouth daily.  12/27/17   [provider]    Family History Family History  Problem Relation Age of Onset  . Stroke Father   . Hypertension Father   . Heart attack Father     Social History Social History   Tobacco Use  . Smoking status: Never Smoker  . Smokeless tobacco: Never Used  Substance Use Topics  . Alcohol use: No  . Drug use: No     Allergies   Buprenorphine  hcl and Morphine and related   Review of Systems Review of Systems  Constitutional: Negative for fever.  HENT: Negative for sore throat.   Respiratory: Negative for shortness of breath.   Cardiovascular: Negative for chest pain.  Gastrointestinal: Positive for diarrhea.  Genitourinary: Negative for flank pain.  Musculoskeletal: Negative for back pain.  Skin: Negative for pallor and wound.  Neurological: Negative for light-headedness and headaches.     Physical Exam Updated Vital Signs BP 105/83 (BP Location: Left Arm)   Pulse 66   Temp 97.8 F (36.6 C) (Oral)   Resp 20   Wt 61.2 kg   SpO2 100%   BMI 27.27 kg/m   Physical Exam Vitals signs and nursing note reviewed.  Constitutional:      Appearance: Normal appearance.  HENT:     Head: Normocephalic and atraumatic.     Mouth/Throat:     Mouth: Mucous membranes are moist.  Cardiovascular:     Rate and Rhythm: Normal rate. Rhythm irregular.  Pulmonary:     Effort: Pulmonary effort is normal.     Breath sounds: No wheezing or rales.  Abdominal:     General: Abdomen is flat. There is no distension.     Palpations: Abdomen is soft.     Tenderness: There is no abdominal tenderness.  Skin:    General: Skin is warm and dry.  Neurological:     Mental Status: She is alert. Mental status is at baseline.      ED Treatments / Results  Labs (all labs ordered are listed, but only abnormal results are displayed) Labs Reviewed  COMPREHENSIVE METABOLIC PANEL - Abnormal; Notable for the following components:      Result Value   Sodium 134 (*)    CO2 17 (*)    Glucose, Bld 119 (*)    BUN 57 (*)    Creatinine, Ser 2.53 (*)    Calcium 8.6 (*)    Total Protein 6.0 (*)    Albumin 2.9 (*)    Total Bilirubin 1.3 (*)    GFR calc non Af Amer 15 (*)    GFR calc Af Amer 18 (*)    All other components within normal limits  BRAIN NATRIURETIC PEPTIDE - Abnormal; Notable for the following components:   B Natriuretic Peptide 360.3  (*)    All other components within normal limits  CBG MONITORING, ED - Abnormal; Notable for the following components:   Glucose-Capillary 111 (*)    All other components within normal limits  SARS CORONAVIRUS 2 (TAT 6-24 HRS)  CBC WITH DIFFERENTIAL/PLATELET    EKG EKG Interpretation  Date/Time:  Tuesday July 29 2019 13:14:47 EST Ventricular Rate:  103 PR Interval:    QRS Duration: 86 QT Interval:  352 QTC Calculation: 412 R Axis:   57 Text Interpretation: Atrial fibrillation Ventricular premature complex No acute changes No significant change since last tracing Confirmed by Varney Biles [phone]) on 07/29/2019 1:18:13 PM   Radiology Dg Chest Portable 1 View  Result Date: 07/29/2019 CLINICAL DATA:  COVID-19 infection, diarrhea, COPD, type II diabetes mellitus, hypertension, CHF, clotting disorder, chronic atrial fibrillation EXAM: PORTABLE CHEST 1 VIEW COMPARISON:  Portable exam 1258 hours compared to 07/28/2019 FINDINGS: Enlargement of cardiac silhouette. Tortuous aorta with atherosclerotic calcification. Mediastinal contours and pulmonary vascularity otherwise normal. Persistent LEFT basilar infiltrate. Lungs appear emphysematous but otherwise clear. No pleural effusion or pneumothorax. IMPRESSION: Enlargement of cardiac silhouette. Emphysematous changes with persistent LEFT lower lobe infiltrate. Electronically Signed   By: Lavonia Dana M.D.   On: 07/29/2019 13:15    Procedures Procedures (including critical care time)  Medications Ordered in ED Medications - No data to display   Initial Impression / Assessment and Plan / ED Course  I have reviewed the triage vital signs and the nursing notes.  Pertinent labs & imaging results that were available during my care of the patient were reviewed by me and considered in my medical decision making (see chart for details).     Patient with a past medical history of A. fib, CHF, COPD presents to the ED with a complaint of  COVID-19 infection.  Is very difficult to obtain history from patient as she has difficulty hearing although hearing aids are in place.  She arrived at 97% on room  air via EMS.  According to family which I personally called to obtain collateral information patient was diagnosed yesterday, reports she felt weaker today, has complained of some dizziness but has been ambulatory otherwise with a stable gait.  She does have some anorexia.  No Covid records have been able to be tracked down,  Xray of chest showed: Enlargement of cardiac silhouette.    Emphysematous changes with persistent LEFT lower lobe infiltrate.    1:32 PM Spoke Lauro Regulus [facility]) granddaughter [phone], who reported patient was diagnosed with Covid yesterday. Seen at [facility] today.Pneumonia on the left lower lobe. Advised to go to [facility] via EMS, but patient refused. PCP told her to have EMS to take her to [facility]. Diarrhea, productive cough, nausea, weak, anorexia. She walks with a walker, has been decreasing, felt dizzy lay down until 10 am.    CBC without any leukocytosis, hemoglobin is within normal limits.  A CMP was remarkable for some hyponatremia, mild, AKI which likely worsened since her last visit.  Her LFTs are within normal limits.  Her BNP today is 360, there are no signs of fluid overload on her chest x-ray, she is currently on 40 mg of IV Lasix.  Patient has not been tachypneic, no hypoxia, vital signs are within normal limits, EKG does show A. fib, heart rate is controlled at this time.  Denies any chest pain.  3:29 PM Spoke to [name] who reported she received her lasix this morning, she has not been eating otherwise. She was instructed on emptying to continue give patient her medication at home.  I have also obtain a COVID-19 swab while in the ED therefore this result will be within her records if patient is to return with worsening symptoms.  I have discussed patient with Dr. Cheral Bay who  agrees with plan an disposition.   Portions of this note were generated with Lobbyist. Dictation errors may occur despite best attempts at proofreading.  Final Clinical Impressions(s) / ED Diagnoses   Final diagnoses:  COVID-19 virus infection  Diarrhea, unspecified type    ED Discharge Orders    None       Janeece Fitting, PA-C 07/29/19 [place], Miciah Shealy, PA-C 07/29/19 [place], MD 07/30/19 [phone]

## 2019-07-29 NOTE — ED Notes (Signed)
Pt  Who is not altered, expressing and verbalizing that she wants to get home.

## 2019-07-29 NOTE — ED Notes (Signed)
Pt's family called, wondering why the pt is being discharged after discussions with the provider who initially authorized the discharge. This RN directed their questions to the doctor Tyrone Nine), after a lengthy discussion the pt is still going home. The grand-daughter is very upset, even after the provider offered to consult the hospitalists. Daughter is on the way.

## 2019-07-29 NOTE — Progress Notes (Signed)
Manufacturing engineer [facility]) Hospice  Ms. Vetsch was referred to hospice today from her PCP office. [facility] was in the process of getting orders verified to begin hospice services, when it was found pt was in ED.    [facility] will follow and help establish hospice in the community once she is discharged.  Please call with any questions or concerns, Venia Carbon RN, BSN, [facility] Liaison (direct dial in [place] or chat via [place]) [phone] (24 hour #)

## 2019-07-29 NOTE — ED Notes (Signed)
I spoke with the patients daughter at 101 about the patient being discharged. Pts daughter stated to me "you're just sending her home to die." I explained to the daughter patient was discharged with close return precautions, return to the hospital if the patient develops resp distress, fever, tachycardia, tachypnea, any worsening concerns to return to the ED. Pts daughter stated, "I will be talking to my lawyer about this." And left with the patient in the car.

## 2019-07-29 NOTE — ED Notes (Signed)
Prior to discharge pt was asked to ambulated in room. Pt did so with a steady gait. Pt was then then asked to have a seat on the stretcher while this NT looked for a wheelchair so that I could take her out to the lobby so that her family could pick her up. Pt then verbalized that she had a BM. This NT Notified Doren Custard, RN about the current situation. Both this NT and Doren Custard, RN cleaned up the pt. Pt was alert and follow instructions as we cleaned her up. Pt was cleaned up and a clean gown was provided. Pt was comfortable and stated that she was ready to go.

## 2019-08-04 ENCOUNTER — Ambulatory Visit: Payer: Medicare Other | Admitting: Cardiology

## 2019-08-11 ENCOUNTER — Other Ambulatory Visit: Payer: Self-pay | Admitting: Cardiology

## 2019-08-12 NOTE — Telephone Encounter (Signed)
Lasix refill sent to [facility] on [address] Tia Alert. Overdue for follow-upf

## 2019-08-28 DIAGNOSIS — M109 Gout, unspecified: Secondary | ICD-10-CM | POA: Diagnosis not present

## 2019-08-28 DIAGNOSIS — D649 Anemia, unspecified: Secondary | ICD-10-CM | POA: Diagnosis not present

## 2019-08-28 DIAGNOSIS — E1122 Type 2 diabetes mellitus with diabetic chronic kidney disease: Secondary | ICD-10-CM | POA: Diagnosis not present

## 2019-08-28 DIAGNOSIS — N183 Chronic kidney disease, stage 3 unspecified: Secondary | ICD-10-CM | POA: Diagnosis not present

## 2019-08-28 DIAGNOSIS — I2699 Other pulmonary embolism without acute cor pulmonale: Secondary | ICD-10-CM | POA: Diagnosis not present

## 2019-08-28 DIAGNOSIS — I82409 Acute embolism and thrombosis of unspecified deep veins of unspecified lower extremity: Secondary | ICD-10-CM | POA: Diagnosis not present

## 2019-08-28 DIAGNOSIS — E1169 Type 2 diabetes mellitus with other specified complication: Secondary | ICD-10-CM | POA: Diagnosis not present

## 2019-09-10 DIAGNOSIS — L03115 Cellulitis of right lower limb: Secondary | ICD-10-CM | POA: Diagnosis not present

## 2019-09-25 DIAGNOSIS — L03115 Cellulitis of right lower limb: Secondary | ICD-10-CM | POA: Diagnosis not present

## 2019-09-25 DIAGNOSIS — R609 Edema, unspecified: Secondary | ICD-10-CM | POA: Diagnosis not present

## 2019-10-01 DIAGNOSIS — R600 Localized edema: Secondary | ICD-10-CM | POA: Diagnosis not present

## 2019-10-01 DIAGNOSIS — L03115 Cellulitis of right lower limb: Secondary | ICD-10-CM | POA: Diagnosis not present

## 2019-10-01 DIAGNOSIS — L988 Other specified disorders of the skin and subcutaneous tissue: Secondary | ICD-10-CM | POA: Diagnosis not present

## 2019-10-01 DIAGNOSIS — L539 Erythematous condition, unspecified: Secondary | ICD-10-CM | POA: Diagnosis not present

## 2019-10-08 DIAGNOSIS — I10 Essential (primary) hypertension: Secondary | ICD-10-CM | POA: Diagnosis not present

## 2019-10-08 DIAGNOSIS — L03115 Cellulitis of right lower limb: Secondary | ICD-10-CM | POA: Diagnosis not present

## 2019-10-08 DIAGNOSIS — S81812A Laceration without foreign body, left lower leg, initial encounter: Secondary | ICD-10-CM | POA: Diagnosis not present

## 2019-10-08 DIAGNOSIS — M7989 Other specified soft tissue disorders: Secondary | ICD-10-CM | POA: Diagnosis not present

## 2019-10-08 DIAGNOSIS — N186 End stage renal disease: Secondary | ICD-10-CM | POA: Diagnosis not present

## 2019-10-08 DIAGNOSIS — E119 Type 2 diabetes mellitus without complications: Secondary | ICD-10-CM | POA: Diagnosis not present

## 2019-10-09 DIAGNOSIS — R21 Rash and other nonspecific skin eruption: Secondary | ICD-10-CM | POA: Diagnosis not present

## 2019-10-09 DIAGNOSIS — I509 Heart failure, unspecified: Secondary | ICD-10-CM | POA: Diagnosis not present

## 2019-10-09 DIAGNOSIS — Z8616 Personal history of COVID-19: Secondary | ICD-10-CM | POA: Diagnosis not present

## 2019-10-09 DIAGNOSIS — L0390 Cellulitis, unspecified: Secondary | ICD-10-CM | POA: Diagnosis not present

## 2019-10-14 DIAGNOSIS — L0390 Cellulitis, unspecified: Secondary | ICD-10-CM | POA: Diagnosis not present

## 2019-10-14 DIAGNOSIS — I509 Heart failure, unspecified: Secondary | ICD-10-CM | POA: Diagnosis not present

## 2019-10-22 DIAGNOSIS — L0390 Cellulitis, unspecified: Secondary | ICD-10-CM | POA: Diagnosis not present

## 2019-10-22 DIAGNOSIS — I509 Heart failure, unspecified: Secondary | ICD-10-CM | POA: Diagnosis not present

## 2019-10-22 DIAGNOSIS — D649 Anemia, unspecified: Secondary | ICD-10-CM | POA: Diagnosis not present

## 2019-10-25 ENCOUNTER — Other Ambulatory Visit: Payer: Self-pay | Admitting: Cardiology

## 2019-10-28 DIAGNOSIS — R0602 Shortness of breath: Secondary | ICD-10-CM | POA: Diagnosis not present

## 2019-10-28 DIAGNOSIS — D649 Anemia, unspecified: Secondary | ICD-10-CM | POA: Diagnosis not present

## 2019-10-28 DIAGNOSIS — E538 Deficiency of other specified B group vitamins: Secondary | ICD-10-CM | POA: Diagnosis not present

## 2019-11-24 ENCOUNTER — Other Ambulatory Visit: Payer: Self-pay | Admitting: Cardiology

## 2019-12-02 DIAGNOSIS — D649 Anemia, unspecified: Secondary | ICD-10-CM | POA: Diagnosis not present

## 2019-12-02 DIAGNOSIS — E1169 Type 2 diabetes mellitus with other specified complication: Secondary | ICD-10-CM | POA: Diagnosis not present

## 2019-12-02 DIAGNOSIS — M109 Gout, unspecified: Secondary | ICD-10-CM | POA: Diagnosis not present

## 2019-12-02 DIAGNOSIS — E039 Hypothyroidism, unspecified: Secondary | ICD-10-CM | POA: Diagnosis not present

## 2019-12-02 DIAGNOSIS — E1122 Type 2 diabetes mellitus with diabetic chronic kidney disease: Secondary | ICD-10-CM | POA: Diagnosis not present

## 2019-12-02 DIAGNOSIS — I2699 Other pulmonary embolism without acute cor pulmonale: Secondary | ICD-10-CM | POA: Diagnosis not present

## 2019-12-02 DIAGNOSIS — N183 Chronic kidney disease, stage 3 unspecified: Secondary | ICD-10-CM | POA: Diagnosis not present

## 2019-12-02 DIAGNOSIS — I82409 Acute embolism and thrombosis of unspecified deep veins of unspecified lower extremity: Secondary | ICD-10-CM | POA: Diagnosis not present

## 2019-12-02 DIAGNOSIS — I509 Heart failure, unspecified: Secondary | ICD-10-CM | POA: Diagnosis not present

## 2019-12-09 DIAGNOSIS — R1310 Dysphagia, unspecified: Secondary | ICD-10-CM | POA: Diagnosis not present

## 2020-01-20 DIAGNOSIS — R079 Chest pain, unspecified: Secondary | ICD-10-CM | POA: Diagnosis not present

## 2020-01-20 DIAGNOSIS — B9689 Other specified bacterial agents as the cause of diseases classified elsewhere: Secondary | ICD-10-CM | POA: Diagnosis not present

## 2020-01-20 DIAGNOSIS — R0789 Other chest pain: Secondary | ICD-10-CM | POA: Diagnosis not present

## 2020-01-20 DIAGNOSIS — N3001 Acute cystitis with hematuria: Secondary | ICD-10-CM | POA: Diagnosis not present

## 2020-01-29 DIAGNOSIS — N390 Urinary tract infection, site not specified: Secondary | ICD-10-CM | POA: Diagnosis not present

## 2020-01-29 DIAGNOSIS — I509 Heart failure, unspecified: Secondary | ICD-10-CM | POA: Diagnosis not present

## 2020-02-10 DIAGNOSIS — I129 Hypertensive chronic kidney disease with stage 1 through stage 4 chronic kidney disease, or unspecified chronic kidney disease: Secondary | ICD-10-CM | POA: Diagnosis not present

## 2020-02-10 DIAGNOSIS — N2581 Secondary hyperparathyroidism of renal origin: Secondary | ICD-10-CM | POA: Diagnosis not present

## 2020-02-10 DIAGNOSIS — N184 Chronic kidney disease, stage 4 (severe): Secondary | ICD-10-CM | POA: Diagnosis not present

## 2020-03-03 DIAGNOSIS — N183 Chronic kidney disease, stage 3 unspecified: Secondary | ICD-10-CM | POA: Diagnosis not present

## 2020-03-03 DIAGNOSIS — E1169 Type 2 diabetes mellitus with other specified complication: Secondary | ICD-10-CM | POA: Diagnosis not present

## 2020-03-03 DIAGNOSIS — E785 Hyperlipidemia, unspecified: Secondary | ICD-10-CM | POA: Diagnosis not present

## 2020-03-03 DIAGNOSIS — I509 Heart failure, unspecified: Secondary | ICD-10-CM | POA: Diagnosis not present

## 2020-03-03 DIAGNOSIS — M109 Gout, unspecified: Secondary | ICD-10-CM | POA: Diagnosis not present

## 2020-03-03 DIAGNOSIS — E1122 Type 2 diabetes mellitus with diabetic chronic kidney disease: Secondary | ICD-10-CM | POA: Diagnosis not present

## 2020-03-03 DIAGNOSIS — D649 Anemia, unspecified: Secondary | ICD-10-CM | POA: Diagnosis not present

## 2020-03-03 DIAGNOSIS — E039 Hypothyroidism, unspecified: Secondary | ICD-10-CM | POA: Diagnosis not present

## 2020-06-04 DIAGNOSIS — E1169 Type 2 diabetes mellitus with other specified complication: Secondary | ICD-10-CM | POA: Diagnosis not present

## 2020-06-04 DIAGNOSIS — N183 Chronic kidney disease, stage 3 unspecified: Secondary | ICD-10-CM | POA: Diagnosis not present

## 2020-06-04 DIAGNOSIS — M109 Gout, unspecified: Secondary | ICD-10-CM | POA: Diagnosis not present

## 2020-06-04 DIAGNOSIS — E1122 Type 2 diabetes mellitus with diabetic chronic kidney disease: Secondary | ICD-10-CM | POA: Diagnosis not present

## 2020-06-04 DIAGNOSIS — E785 Hyperlipidemia, unspecified: Secondary | ICD-10-CM | POA: Diagnosis not present

## 2020-06-04 DIAGNOSIS — E039 Hypothyroidism, unspecified: Secondary | ICD-10-CM | POA: Diagnosis not present

## 2020-06-04 DIAGNOSIS — D649 Anemia, unspecified: Secondary | ICD-10-CM | POA: Diagnosis not present

## 2020-06-30 DIAGNOSIS — N184 Chronic kidney disease, stage 4 (severe): Secondary | ICD-10-CM | POA: Diagnosis not present

## 2020-06-30 DIAGNOSIS — L03115 Cellulitis of right lower limb: Secondary | ICD-10-CM | POA: Diagnosis not present

## 2020-06-30 DIAGNOSIS — I129 Hypertensive chronic kidney disease with stage 1 through stage 4 chronic kidney disease, or unspecified chronic kidney disease: Secondary | ICD-10-CM | POA: Diagnosis not present

## 2020-07-01 DIAGNOSIS — I82432 Acute embolism and thrombosis of left popliteal vein: Secondary | ICD-10-CM | POA: Diagnosis not present

## 2020-07-01 DIAGNOSIS — N184 Chronic kidney disease, stage 4 (severe): Secondary | ICD-10-CM | POA: Diagnosis not present

## 2020-07-01 DIAGNOSIS — E1122 Type 2 diabetes mellitus with diabetic chronic kidney disease: Secondary | ICD-10-CM | POA: Diagnosis not present

## 2020-07-01 DIAGNOSIS — I129 Hypertensive chronic kidney disease with stage 1 through stage 4 chronic kidney disease, or unspecified chronic kidney disease: Secondary | ICD-10-CM | POA: Diagnosis not present

## 2020-07-01 DIAGNOSIS — I82412 Acute embolism and thrombosis of left femoral vein: Secondary | ICD-10-CM | POA: Diagnosis not present

## 2020-07-01 DIAGNOSIS — I5032 Chronic diastolic (congestive) heart failure: Secondary | ICD-10-CM | POA: Diagnosis not present

## 2020-07-01 DIAGNOSIS — I130 Hypertensive heart and chronic kidney disease with heart failure and stage 1 through stage 4 chronic kidney disease, or unspecified chronic kidney disease: Secondary | ICD-10-CM | POA: Diagnosis not present

## 2020-07-01 DIAGNOSIS — E113292 Type 2 diabetes mellitus with mild nonproliferative diabetic retinopathy without macular edema, left eye: Secondary | ICD-10-CM | POA: Diagnosis not present

## 2020-07-01 DIAGNOSIS — Z742 Need for assistance at home and no other household member able to render care: Secondary | ICD-10-CM | POA: Diagnosis not present

## 2020-07-01 DIAGNOSIS — M79604 Pain in right leg: Secondary | ICD-10-CM | POA: Diagnosis not present

## 2020-07-01 DIAGNOSIS — Z7901 Long term (current) use of anticoagulants: Secondary | ICD-10-CM | POA: Diagnosis not present

## 2020-07-01 DIAGNOSIS — L03115 Cellulitis of right lower limb: Secondary | ICD-10-CM | POA: Diagnosis not present

## 2020-07-01 DIAGNOSIS — Z79899 Other long term (current) drug therapy: Secondary | ICD-10-CM | POA: Diagnosis not present

## 2020-07-01 DIAGNOSIS — S8012XD Contusion of left lower leg, subsequent encounter: Secondary | ICD-10-CM | POA: Diagnosis not present

## 2020-07-01 DIAGNOSIS — I482 Chronic atrial fibrillation, unspecified: Secondary | ICD-10-CM | POA: Diagnosis not present

## 2020-07-01 DIAGNOSIS — Z86711 Personal history of pulmonary embolism: Secondary | ICD-10-CM | POA: Diagnosis not present

## 2020-07-01 DIAGNOSIS — M7989 Other specified soft tissue disorders: Secondary | ICD-10-CM | POA: Diagnosis not present

## 2020-07-01 DIAGNOSIS — J449 Chronic obstructive pulmonary disease, unspecified: Secondary | ICD-10-CM | POA: Diagnosis not present

## 2020-07-01 DIAGNOSIS — E039 Hypothyroidism, unspecified: Secondary | ICD-10-CM | POA: Diagnosis not present

## 2020-07-03 DIAGNOSIS — I130 Hypertensive heart and chronic kidney disease with heart failure and stage 1 through stage 4 chronic kidney disease, or unspecified chronic kidney disease: Secondary | ICD-10-CM

## 2020-07-07 DIAGNOSIS — Z885 Allergy status to narcotic agent status: Secondary | ICD-10-CM | POA: Diagnosis not present

## 2020-07-07 DIAGNOSIS — E039 Hypothyroidism, unspecified: Secondary | ICD-10-CM | POA: Diagnosis not present

## 2020-07-07 DIAGNOSIS — N170 Acute kidney failure with tubular necrosis: Secondary | ICD-10-CM | POA: Diagnosis not present

## 2020-07-07 DIAGNOSIS — N184 Chronic kidney disease, stage 4 (severe): Secondary | ICD-10-CM | POA: Diagnosis not present

## 2020-07-07 DIAGNOSIS — E785 Hyperlipidemia, unspecified: Secondary | ICD-10-CM | POA: Diagnosis not present

## 2020-07-07 DIAGNOSIS — I482 Chronic atrial fibrillation, unspecified: Secondary | ICD-10-CM | POA: Diagnosis not present

## 2020-07-07 DIAGNOSIS — J449 Chronic obstructive pulmonary disease, unspecified: Secondary | ICD-10-CM | POA: Diagnosis not present

## 2020-07-07 DIAGNOSIS — E7800 Pure hypercholesterolemia, unspecified: Secondary | ICD-10-CM | POA: Diagnosis not present

## 2020-07-07 DIAGNOSIS — I130 Hypertensive heart and chronic kidney disease with heart failure and stage 1 through stage 4 chronic kidney disease, or unspecified chronic kidney disease: Secondary | ICD-10-CM | POA: Diagnosis not present

## 2020-07-07 DIAGNOSIS — A419 Sepsis, unspecified organism: Secondary | ICD-10-CM | POA: Diagnosis not present

## 2020-07-07 DIAGNOSIS — Z881 Allergy status to other antibiotic agents status: Secondary | ICD-10-CM | POA: Diagnosis not present

## 2020-07-07 DIAGNOSIS — R0902 Hypoxemia: Secondary | ICD-10-CM | POA: Diagnosis not present

## 2020-07-07 DIAGNOSIS — M1990 Unspecified osteoarthritis, unspecified site: Secondary | ICD-10-CM | POA: Diagnosis not present

## 2020-07-07 DIAGNOSIS — J90 Pleural effusion, not elsewhere classified: Secondary | ICD-10-CM | POA: Diagnosis not present

## 2020-07-07 DIAGNOSIS — L0390 Cellulitis, unspecified: Secondary | ICD-10-CM | POA: Diagnosis not present

## 2020-07-07 DIAGNOSIS — Z950 Presence of cardiac pacemaker: Secondary | ICD-10-CM | POA: Diagnosis not present

## 2020-07-07 DIAGNOSIS — I82412 Acute embolism and thrombosis of left femoral vein: Secondary | ICD-10-CM | POA: Diagnosis not present

## 2020-07-07 DIAGNOSIS — Z951 Presence of aortocoronary bypass graft: Secondary | ICD-10-CM | POA: Diagnosis not present

## 2020-07-07 DIAGNOSIS — S41111D Laceration without foreign body of right upper arm, subsequent encounter: Secondary | ICD-10-CM | POA: Diagnosis not present

## 2020-07-07 DIAGNOSIS — G9341 Metabolic encephalopathy: Secondary | ICD-10-CM | POA: Diagnosis not present

## 2020-07-07 DIAGNOSIS — R609 Edema, unspecified: Secondary | ICD-10-CM | POA: Diagnosis not present

## 2020-07-07 DIAGNOSIS — I517 Cardiomegaly: Secondary | ICD-10-CM | POA: Diagnosis not present

## 2020-07-07 DIAGNOSIS — Z743 Need for continuous supervision: Secondary | ICD-10-CM | POA: Diagnosis not present

## 2020-07-07 DIAGNOSIS — L03115 Cellulitis of right lower limb: Secondary | ICD-10-CM | POA: Diagnosis not present

## 2020-07-07 DIAGNOSIS — Z86711 Personal history of pulmonary embolism: Secondary | ICD-10-CM | POA: Diagnosis not present

## 2020-07-07 DIAGNOSIS — R6520 Severe sepsis without septic shock: Secondary | ICD-10-CM | POA: Diagnosis not present

## 2020-07-07 DIAGNOSIS — K219 Gastro-esophageal reflux disease without esophagitis: Secondary | ICD-10-CM | POA: Diagnosis not present

## 2020-07-07 DIAGNOSIS — E1165 Type 2 diabetes mellitus with hyperglycemia: Secondary | ICD-10-CM | POA: Diagnosis not present

## 2020-07-07 DIAGNOSIS — R52 Pain, unspecified: Secondary | ICD-10-CM | POA: Diagnosis not present

## 2020-07-07 DIAGNOSIS — I5032 Chronic diastolic (congestive) heart failure: Secondary | ICD-10-CM | POA: Diagnosis not present

## 2020-07-07 DIAGNOSIS — Z452 Encounter for adjustment and management of vascular access device: Secondary | ICD-10-CM | POA: Diagnosis not present

## 2020-07-07 DIAGNOSIS — M109 Gout, unspecified: Secondary | ICD-10-CM | POA: Diagnosis not present

## 2020-07-07 DIAGNOSIS — S81811D Laceration without foreign body, right lower leg, subsequent encounter: Secondary | ICD-10-CM | POA: Diagnosis not present

## 2020-07-08 DIAGNOSIS — N170 Acute kidney failure with tubular necrosis: Secondary | ICD-10-CM | POA: Diagnosis not present

## 2020-07-08 DIAGNOSIS — G9341 Metabolic encephalopathy: Secondary | ICD-10-CM | POA: Diagnosis not present

## 2020-07-08 DIAGNOSIS — A419 Sepsis, unspecified organism: Secondary | ICD-10-CM | POA: Diagnosis not present

## 2020-07-09 DIAGNOSIS — N170 Acute kidney failure with tubular necrosis: Secondary | ICD-10-CM | POA: Diagnosis not present

## 2020-07-09 DIAGNOSIS — G9341 Metabolic encephalopathy: Secondary | ICD-10-CM | POA: Diagnosis not present

## 2020-07-09 DIAGNOSIS — A419 Sepsis, unspecified organism: Secondary | ICD-10-CM | POA: Diagnosis not present

## 2020-07-09 DIAGNOSIS — Z452 Encounter for adjustment and management of vascular access device: Secondary | ICD-10-CM | POA: Diagnosis not present

## 2020-07-10 DIAGNOSIS — N170 Acute kidney failure with tubular necrosis: Secondary | ICD-10-CM | POA: Diagnosis not present

## 2020-07-10 DIAGNOSIS — A419 Sepsis, unspecified organism: Secondary | ICD-10-CM | POA: Diagnosis not present

## 2020-07-10 DIAGNOSIS — G9341 Metabolic encephalopathy: Secondary | ICD-10-CM | POA: Diagnosis not present

## 2020-07-11 DIAGNOSIS — N170 Acute kidney failure with tubular necrosis: Secondary | ICD-10-CM | POA: Diagnosis not present

## 2020-07-11 DIAGNOSIS — A419 Sepsis, unspecified organism: Secondary | ICD-10-CM | POA: Diagnosis not present

## 2020-07-11 DIAGNOSIS — G9341 Metabolic encephalopathy: Secondary | ICD-10-CM | POA: Diagnosis not present

## 2020-07-12 DIAGNOSIS — N170 Acute kidney failure with tubular necrosis: Secondary | ICD-10-CM | POA: Diagnosis not present

## 2020-07-12 DIAGNOSIS — A419 Sepsis, unspecified organism: Secondary | ICD-10-CM | POA: Diagnosis not present

## 2020-07-12 DIAGNOSIS — G9341 Metabolic encephalopathy: Secondary | ICD-10-CM | POA: Diagnosis not present

## 2020-07-13 DIAGNOSIS — A419 Sepsis, unspecified organism: Secondary | ICD-10-CM | POA: Diagnosis not present

## 2020-07-13 DIAGNOSIS — N170 Acute kidney failure with tubular necrosis: Secondary | ICD-10-CM | POA: Diagnosis not present

## 2020-07-13 DIAGNOSIS — G9341 Metabolic encephalopathy: Secondary | ICD-10-CM | POA: Diagnosis not present

## 2020-07-15 DIAGNOSIS — I130 Hypertensive heart and chronic kidney disease with heart failure and stage 1 through stage 4 chronic kidney disease, or unspecified chronic kidney disease: Secondary | ICD-10-CM | POA: Diagnosis not present

## 2020-07-15 DIAGNOSIS — Z87440 Personal history of urinary (tract) infections: Secondary | ICD-10-CM | POA: Diagnosis not present

## 2020-07-15 DIAGNOSIS — I82432 Acute embolism and thrombosis of left popliteal vein: Secondary | ICD-10-CM | POA: Diagnosis not present

## 2020-07-15 DIAGNOSIS — I482 Chronic atrial fibrillation, unspecified: Secondary | ICD-10-CM | POA: Diagnosis not present

## 2020-07-15 DIAGNOSIS — Z9181 History of falling: Secondary | ICD-10-CM | POA: Diagnosis not present

## 2020-07-15 DIAGNOSIS — S21209D Unspecified open wound of unspecified back wall of thorax without penetration into thoracic cavity, subsequent encounter: Secondary | ICD-10-CM | POA: Diagnosis not present

## 2020-07-15 DIAGNOSIS — N184 Chronic kidney disease, stage 4 (severe): Secondary | ICD-10-CM | POA: Diagnosis not present

## 2020-07-15 DIAGNOSIS — Z7951 Long term (current) use of inhaled steroids: Secondary | ICD-10-CM | POA: Diagnosis not present

## 2020-07-15 DIAGNOSIS — M109 Gout, unspecified: Secondary | ICD-10-CM | POA: Diagnosis not present

## 2020-07-15 DIAGNOSIS — E039 Hypothyroidism, unspecified: Secondary | ICD-10-CM | POA: Diagnosis not present

## 2020-07-15 DIAGNOSIS — H9190 Unspecified hearing loss, unspecified ear: Secondary | ICD-10-CM | POA: Diagnosis not present

## 2020-07-15 DIAGNOSIS — Z7901 Long term (current) use of anticoagulants: Secondary | ICD-10-CM | POA: Diagnosis not present

## 2020-07-15 DIAGNOSIS — L03115 Cellulitis of right lower limb: Secondary | ICD-10-CM | POA: Diagnosis not present

## 2020-07-15 DIAGNOSIS — I5032 Chronic diastolic (congestive) heart failure: Secondary | ICD-10-CM | POA: Diagnosis not present

## 2020-07-15 DIAGNOSIS — Z950 Presence of cardiac pacemaker: Secondary | ICD-10-CM | POA: Diagnosis not present

## 2020-07-15 DIAGNOSIS — E1122 Type 2 diabetes mellitus with diabetic chronic kidney disease: Secondary | ICD-10-CM | POA: Diagnosis not present

## 2020-07-15 DIAGNOSIS — M1990 Unspecified osteoarthritis, unspecified site: Secondary | ICD-10-CM | POA: Diagnosis not present

## 2020-07-15 DIAGNOSIS — S41101D Unspecified open wound of right upper arm, subsequent encounter: Secondary | ICD-10-CM | POA: Diagnosis not present

## 2020-07-15 DIAGNOSIS — Z86711 Personal history of pulmonary embolism: Secondary | ICD-10-CM | POA: Diagnosis not present

## 2020-07-15 DIAGNOSIS — J449 Chronic obstructive pulmonary disease, unspecified: Secondary | ICD-10-CM | POA: Diagnosis not present

## 2020-07-15 DIAGNOSIS — E785 Hyperlipidemia, unspecified: Secondary | ICD-10-CM | POA: Diagnosis not present

## 2020-07-15 DIAGNOSIS — E113292 Type 2 diabetes mellitus with mild nonproliferative diabetic retinopathy without macular edema, left eye: Secondary | ICD-10-CM | POA: Diagnosis not present

## 2020-07-15 DIAGNOSIS — K219 Gastro-esophageal reflux disease without esophagitis: Secondary | ICD-10-CM | POA: Diagnosis not present

## 2020-07-15 DIAGNOSIS — S8011XD Contusion of right lower leg, subsequent encounter: Secondary | ICD-10-CM | POA: Diagnosis not present

## 2020-07-18 DIAGNOSIS — I130 Hypertensive heart and chronic kidney disease with heart failure and stage 1 through stage 4 chronic kidney disease, or unspecified chronic kidney disease: Secondary | ICD-10-CM | POA: Diagnosis not present

## 2020-07-18 DIAGNOSIS — E113292 Type 2 diabetes mellitus with mild nonproliferative diabetic retinopathy without macular edema, left eye: Secondary | ICD-10-CM | POA: Diagnosis not present

## 2020-07-18 DIAGNOSIS — J449 Chronic obstructive pulmonary disease, unspecified: Secondary | ICD-10-CM | POA: Diagnosis not present

## 2020-07-18 DIAGNOSIS — M1990 Unspecified osteoarthritis, unspecified site: Secondary | ICD-10-CM | POA: Diagnosis not present

## 2020-07-18 DIAGNOSIS — I482 Chronic atrial fibrillation, unspecified: Secondary | ICD-10-CM | POA: Diagnosis not present

## 2020-07-18 DIAGNOSIS — I5032 Chronic diastolic (congestive) heart failure: Secondary | ICD-10-CM | POA: Diagnosis not present

## 2020-07-18 DIAGNOSIS — H9190 Unspecified hearing loss, unspecified ear: Secondary | ICD-10-CM | POA: Diagnosis not present

## 2020-07-18 DIAGNOSIS — E785 Hyperlipidemia, unspecified: Secondary | ICD-10-CM | POA: Diagnosis not present

## 2020-07-18 DIAGNOSIS — I82432 Acute embolism and thrombosis of left popliteal vein: Secondary | ICD-10-CM | POA: Diagnosis not present

## 2020-07-18 DIAGNOSIS — Z86711 Personal history of pulmonary embolism: Secondary | ICD-10-CM | POA: Diagnosis not present

## 2020-07-18 DIAGNOSIS — M109 Gout, unspecified: Secondary | ICD-10-CM | POA: Diagnosis not present

## 2020-07-18 DIAGNOSIS — L03115 Cellulitis of right lower limb: Secondary | ICD-10-CM | POA: Diagnosis not present

## 2020-07-18 DIAGNOSIS — Z7951 Long term (current) use of inhaled steroids: Secondary | ICD-10-CM | POA: Diagnosis not present

## 2020-07-18 DIAGNOSIS — N184 Chronic kidney disease, stage 4 (severe): Secondary | ICD-10-CM | POA: Diagnosis not present

## 2020-07-18 DIAGNOSIS — S8011XD Contusion of right lower leg, subsequent encounter: Secondary | ICD-10-CM | POA: Diagnosis not present

## 2020-07-18 DIAGNOSIS — Z7901 Long term (current) use of anticoagulants: Secondary | ICD-10-CM | POA: Diagnosis not present

## 2020-07-18 DIAGNOSIS — Z950 Presence of cardiac pacemaker: Secondary | ICD-10-CM | POA: Diagnosis not present

## 2020-07-18 DIAGNOSIS — E1122 Type 2 diabetes mellitus with diabetic chronic kidney disease: Secondary | ICD-10-CM | POA: Diagnosis not present

## 2020-07-18 DIAGNOSIS — S21209D Unspecified open wound of unspecified back wall of thorax without penetration into thoracic cavity, subsequent encounter: Secondary | ICD-10-CM | POA: Diagnosis not present

## 2020-07-18 DIAGNOSIS — K219 Gastro-esophageal reflux disease without esophagitis: Secondary | ICD-10-CM | POA: Diagnosis not present

## 2020-07-18 DIAGNOSIS — Z87440 Personal history of urinary (tract) infections: Secondary | ICD-10-CM | POA: Diagnosis not present

## 2020-07-18 DIAGNOSIS — Z9181 History of falling: Secondary | ICD-10-CM | POA: Diagnosis not present

## 2020-07-18 DIAGNOSIS — E039 Hypothyroidism, unspecified: Secondary | ICD-10-CM | POA: Diagnosis not present

## 2020-07-18 DIAGNOSIS — S41101D Unspecified open wound of right upper arm, subsequent encounter: Secondary | ICD-10-CM | POA: Diagnosis not present

## 2020-07-19 DIAGNOSIS — K219 Gastro-esophageal reflux disease without esophagitis: Secondary | ICD-10-CM | POA: Diagnosis not present

## 2020-07-19 DIAGNOSIS — S8011XD Contusion of right lower leg, subsequent encounter: Secondary | ICD-10-CM | POA: Diagnosis not present

## 2020-07-19 DIAGNOSIS — I482 Chronic atrial fibrillation, unspecified: Secondary | ICD-10-CM | POA: Diagnosis not present

## 2020-07-19 DIAGNOSIS — Z9181 History of falling: Secondary | ICD-10-CM | POA: Diagnosis not present

## 2020-07-19 DIAGNOSIS — M109 Gout, unspecified: Secondary | ICD-10-CM | POA: Diagnosis not present

## 2020-07-19 DIAGNOSIS — S21209D Unspecified open wound of unspecified back wall of thorax without penetration into thoracic cavity, subsequent encounter: Secondary | ICD-10-CM | POA: Diagnosis not present

## 2020-07-19 DIAGNOSIS — H9190 Unspecified hearing loss, unspecified ear: Secondary | ICD-10-CM | POA: Diagnosis not present

## 2020-07-19 DIAGNOSIS — I130 Hypertensive heart and chronic kidney disease with heart failure and stage 1 through stage 4 chronic kidney disease, or unspecified chronic kidney disease: Secondary | ICD-10-CM | POA: Diagnosis not present

## 2020-07-19 DIAGNOSIS — Z87440 Personal history of urinary (tract) infections: Secondary | ICD-10-CM | POA: Diagnosis not present

## 2020-07-19 DIAGNOSIS — Z86711 Personal history of pulmonary embolism: Secondary | ICD-10-CM | POA: Diagnosis not present

## 2020-07-19 DIAGNOSIS — E1122 Type 2 diabetes mellitus with diabetic chronic kidney disease: Secondary | ICD-10-CM | POA: Diagnosis not present

## 2020-07-19 DIAGNOSIS — Z7901 Long term (current) use of anticoagulants: Secondary | ICD-10-CM | POA: Diagnosis not present

## 2020-07-19 DIAGNOSIS — L03115 Cellulitis of right lower limb: Secondary | ICD-10-CM | POA: Diagnosis not present

## 2020-07-19 DIAGNOSIS — E113292 Type 2 diabetes mellitus with mild nonproliferative diabetic retinopathy without macular edema, left eye: Secondary | ICD-10-CM | POA: Diagnosis not present

## 2020-07-19 DIAGNOSIS — M1990 Unspecified osteoarthritis, unspecified site: Secondary | ICD-10-CM | POA: Diagnosis not present

## 2020-07-19 DIAGNOSIS — E039 Hypothyroidism, unspecified: Secondary | ICD-10-CM | POA: Diagnosis not present

## 2020-07-19 DIAGNOSIS — I5032 Chronic diastolic (congestive) heart failure: Secondary | ICD-10-CM | POA: Diagnosis not present

## 2020-07-19 DIAGNOSIS — I82432 Acute embolism and thrombosis of left popliteal vein: Secondary | ICD-10-CM | POA: Diagnosis not present

## 2020-07-19 DIAGNOSIS — S41101D Unspecified open wound of right upper arm, subsequent encounter: Secondary | ICD-10-CM | POA: Diagnosis not present

## 2020-07-19 DIAGNOSIS — Z7951 Long term (current) use of inhaled steroids: Secondary | ICD-10-CM | POA: Diagnosis not present

## 2020-07-19 DIAGNOSIS — E785 Hyperlipidemia, unspecified: Secondary | ICD-10-CM | POA: Diagnosis not present

## 2020-07-19 DIAGNOSIS — J449 Chronic obstructive pulmonary disease, unspecified: Secondary | ICD-10-CM | POA: Diagnosis not present

## 2020-07-19 DIAGNOSIS — N184 Chronic kidney disease, stage 4 (severe): Secondary | ICD-10-CM | POA: Diagnosis not present

## 2020-07-19 DIAGNOSIS — Z950 Presence of cardiac pacemaker: Secondary | ICD-10-CM | POA: Diagnosis not present

## 2020-07-20 DIAGNOSIS — Z9181 History of falling: Secondary | ICD-10-CM | POA: Diagnosis not present

## 2020-07-20 DIAGNOSIS — E785 Hyperlipidemia, unspecified: Secondary | ICD-10-CM | POA: Diagnosis not present

## 2020-07-20 DIAGNOSIS — Z139 Encounter for screening, unspecified: Secondary | ICD-10-CM | POA: Diagnosis not present

## 2020-07-20 DIAGNOSIS — Z0000 Encounter for general adult medical examination without abnormal findings: Secondary | ICD-10-CM | POA: Diagnosis not present

## 2020-07-21 DIAGNOSIS — L0390 Cellulitis, unspecified: Secondary | ICD-10-CM | POA: Diagnosis not present

## 2020-07-21 DIAGNOSIS — Z7689 Persons encountering health services in other specified circumstances: Secondary | ICD-10-CM | POA: Diagnosis not present

## 2020-07-21 DIAGNOSIS — Z79899 Other long term (current) drug therapy: Secondary | ICD-10-CM | POA: Diagnosis not present

## 2020-07-21 DIAGNOSIS — A419 Sepsis, unspecified organism: Secondary | ICD-10-CM | POA: Diagnosis not present

## 2020-07-21 DIAGNOSIS — I82409 Acute embolism and thrombosis of unspecified deep veins of unspecified lower extremity: Secondary | ICD-10-CM | POA: Diagnosis not present

## 2020-07-21 DIAGNOSIS — Z139 Encounter for screening, unspecified: Secondary | ICD-10-CM | POA: Diagnosis not present

## 2020-07-21 DIAGNOSIS — Z9181 History of falling: Secondary | ICD-10-CM | POA: Diagnosis not present

## 2020-07-22 DIAGNOSIS — I482 Chronic atrial fibrillation, unspecified: Secondary | ICD-10-CM | POA: Diagnosis not present

## 2020-07-22 DIAGNOSIS — Z7901 Long term (current) use of anticoagulants: Secondary | ICD-10-CM | POA: Diagnosis not present

## 2020-07-22 DIAGNOSIS — S41101D Unspecified open wound of right upper arm, subsequent encounter: Secondary | ICD-10-CM | POA: Diagnosis not present

## 2020-07-22 DIAGNOSIS — L03115 Cellulitis of right lower limb: Secondary | ICD-10-CM | POA: Diagnosis not present

## 2020-07-22 DIAGNOSIS — Z950 Presence of cardiac pacemaker: Secondary | ICD-10-CM | POA: Diagnosis not present

## 2020-07-22 DIAGNOSIS — I5032 Chronic diastolic (congestive) heart failure: Secondary | ICD-10-CM | POA: Diagnosis not present

## 2020-07-22 DIAGNOSIS — N184 Chronic kidney disease, stage 4 (severe): Secondary | ICD-10-CM | POA: Diagnosis not present

## 2020-07-22 DIAGNOSIS — Z7951 Long term (current) use of inhaled steroids: Secondary | ICD-10-CM | POA: Diagnosis not present

## 2020-07-22 DIAGNOSIS — E1122 Type 2 diabetes mellitus with diabetic chronic kidney disease: Secondary | ICD-10-CM | POA: Diagnosis not present

## 2020-07-22 DIAGNOSIS — M109 Gout, unspecified: Secondary | ICD-10-CM | POA: Diagnosis not present

## 2020-07-22 DIAGNOSIS — I130 Hypertensive heart and chronic kidney disease with heart failure and stage 1 through stage 4 chronic kidney disease, or unspecified chronic kidney disease: Secondary | ICD-10-CM | POA: Diagnosis not present

## 2020-07-22 DIAGNOSIS — Z86711 Personal history of pulmonary embolism: Secondary | ICD-10-CM | POA: Diagnosis not present

## 2020-07-22 DIAGNOSIS — Z87440 Personal history of urinary (tract) infections: Secondary | ICD-10-CM | POA: Diagnosis not present

## 2020-07-22 DIAGNOSIS — E039 Hypothyroidism, unspecified: Secondary | ICD-10-CM | POA: Diagnosis not present

## 2020-07-22 DIAGNOSIS — E113292 Type 2 diabetes mellitus with mild nonproliferative diabetic retinopathy without macular edema, left eye: Secondary | ICD-10-CM | POA: Diagnosis not present

## 2020-07-22 DIAGNOSIS — J449 Chronic obstructive pulmonary disease, unspecified: Secondary | ICD-10-CM | POA: Diagnosis not present

## 2020-07-22 DIAGNOSIS — S8011XD Contusion of right lower leg, subsequent encounter: Secondary | ICD-10-CM | POA: Diagnosis not present

## 2020-07-22 DIAGNOSIS — E785 Hyperlipidemia, unspecified: Secondary | ICD-10-CM | POA: Diagnosis not present

## 2020-07-22 DIAGNOSIS — H9190 Unspecified hearing loss, unspecified ear: Secondary | ICD-10-CM | POA: Diagnosis not present

## 2020-07-22 DIAGNOSIS — Z9181 History of falling: Secondary | ICD-10-CM | POA: Diagnosis not present

## 2020-07-22 DIAGNOSIS — K219 Gastro-esophageal reflux disease without esophagitis: Secondary | ICD-10-CM | POA: Diagnosis not present

## 2020-07-22 DIAGNOSIS — I82432 Acute embolism and thrombosis of left popliteal vein: Secondary | ICD-10-CM | POA: Diagnosis not present

## 2020-07-22 DIAGNOSIS — M1990 Unspecified osteoarthritis, unspecified site: Secondary | ICD-10-CM | POA: Diagnosis not present

## 2020-07-22 DIAGNOSIS — S21209D Unspecified open wound of unspecified back wall of thorax without penetration into thoracic cavity, subsequent encounter: Secondary | ICD-10-CM | POA: Diagnosis not present

## 2020-07-26 DIAGNOSIS — L03115 Cellulitis of right lower limb: Secondary | ICD-10-CM | POA: Diagnosis not present

## 2020-07-26 DIAGNOSIS — M1990 Unspecified osteoarthritis, unspecified site: Secondary | ICD-10-CM | POA: Diagnosis not present

## 2020-07-26 DIAGNOSIS — H9190 Unspecified hearing loss, unspecified ear: Secondary | ICD-10-CM | POA: Diagnosis not present

## 2020-07-26 DIAGNOSIS — Z9181 History of falling: Secondary | ICD-10-CM | POA: Diagnosis not present

## 2020-07-26 DIAGNOSIS — I82432 Acute embolism and thrombosis of left popliteal vein: Secondary | ICD-10-CM | POA: Diagnosis not present

## 2020-07-26 DIAGNOSIS — E785 Hyperlipidemia, unspecified: Secondary | ICD-10-CM | POA: Diagnosis not present

## 2020-07-26 DIAGNOSIS — I5032 Chronic diastolic (congestive) heart failure: Secondary | ICD-10-CM | POA: Diagnosis not present

## 2020-07-26 DIAGNOSIS — Z7901 Long term (current) use of anticoagulants: Secondary | ICD-10-CM | POA: Diagnosis not present

## 2020-07-26 DIAGNOSIS — J449 Chronic obstructive pulmonary disease, unspecified: Secondary | ICD-10-CM | POA: Diagnosis not present

## 2020-07-26 DIAGNOSIS — K219 Gastro-esophageal reflux disease without esophagitis: Secondary | ICD-10-CM | POA: Diagnosis not present

## 2020-07-26 DIAGNOSIS — S41101D Unspecified open wound of right upper arm, subsequent encounter: Secondary | ICD-10-CM | POA: Diagnosis not present

## 2020-07-26 DIAGNOSIS — Z950 Presence of cardiac pacemaker: Secondary | ICD-10-CM | POA: Diagnosis not present

## 2020-07-26 DIAGNOSIS — Z86711 Personal history of pulmonary embolism: Secondary | ICD-10-CM | POA: Diagnosis not present

## 2020-07-26 DIAGNOSIS — I482 Chronic atrial fibrillation, unspecified: Secondary | ICD-10-CM | POA: Diagnosis not present

## 2020-07-26 DIAGNOSIS — S21209D Unspecified open wound of unspecified back wall of thorax without penetration into thoracic cavity, subsequent encounter: Secondary | ICD-10-CM | POA: Diagnosis not present

## 2020-07-26 DIAGNOSIS — I130 Hypertensive heart and chronic kidney disease with heart failure and stage 1 through stage 4 chronic kidney disease, or unspecified chronic kidney disease: Secondary | ICD-10-CM | POA: Diagnosis not present

## 2020-07-26 DIAGNOSIS — N184 Chronic kidney disease, stage 4 (severe): Secondary | ICD-10-CM | POA: Diagnosis not present

## 2020-07-26 DIAGNOSIS — E1122 Type 2 diabetes mellitus with diabetic chronic kidney disease: Secondary | ICD-10-CM | POA: Diagnosis not present

## 2020-07-26 DIAGNOSIS — Z7951 Long term (current) use of inhaled steroids: Secondary | ICD-10-CM | POA: Diagnosis not present

## 2020-07-26 DIAGNOSIS — S8011XD Contusion of right lower leg, subsequent encounter: Secondary | ICD-10-CM | POA: Diagnosis not present

## 2020-07-26 DIAGNOSIS — Z87440 Personal history of urinary (tract) infections: Secondary | ICD-10-CM | POA: Diagnosis not present

## 2020-07-26 DIAGNOSIS — E113292 Type 2 diabetes mellitus with mild nonproliferative diabetic retinopathy without macular edema, left eye: Secondary | ICD-10-CM | POA: Diagnosis not present

## 2020-07-26 DIAGNOSIS — E039 Hypothyroidism, unspecified: Secondary | ICD-10-CM | POA: Diagnosis not present

## 2020-07-26 DIAGNOSIS — M109 Gout, unspecified: Secondary | ICD-10-CM | POA: Diagnosis not present

## 2020-07-28 DIAGNOSIS — L03115 Cellulitis of right lower limb: Secondary | ICD-10-CM | POA: Diagnosis not present

## 2020-07-28 DIAGNOSIS — I509 Heart failure, unspecified: Secondary | ICD-10-CM | POA: Diagnosis not present

## 2020-07-28 DIAGNOSIS — N184 Chronic kidney disease, stage 4 (severe): Secondary | ICD-10-CM | POA: Diagnosis not present

## 2020-07-28 DIAGNOSIS — I82409 Acute embolism and thrombosis of unspecified deep veins of unspecified lower extremity: Secondary | ICD-10-CM | POA: Diagnosis not present

## 2020-07-28 DIAGNOSIS — I130 Hypertensive heart and chronic kidney disease with heart failure and stage 1 through stage 4 chronic kidney disease, or unspecified chronic kidney disease: Secondary | ICD-10-CM | POA: Diagnosis not present

## 2020-07-29 DIAGNOSIS — I5032 Chronic diastolic (congestive) heart failure: Secondary | ICD-10-CM | POA: Diagnosis not present

## 2020-07-29 DIAGNOSIS — Z87440 Personal history of urinary (tract) infections: Secondary | ICD-10-CM | POA: Diagnosis not present

## 2020-07-29 DIAGNOSIS — E785 Hyperlipidemia, unspecified: Secondary | ICD-10-CM | POA: Diagnosis not present

## 2020-07-29 DIAGNOSIS — E039 Hypothyroidism, unspecified: Secondary | ICD-10-CM | POA: Diagnosis not present

## 2020-07-29 DIAGNOSIS — Z7951 Long term (current) use of inhaled steroids: Secondary | ICD-10-CM | POA: Diagnosis not present

## 2020-07-29 DIAGNOSIS — Z950 Presence of cardiac pacemaker: Secondary | ICD-10-CM | POA: Diagnosis not present

## 2020-07-29 DIAGNOSIS — J449 Chronic obstructive pulmonary disease, unspecified: Secondary | ICD-10-CM | POA: Diagnosis not present

## 2020-07-29 DIAGNOSIS — Z9181 History of falling: Secondary | ICD-10-CM | POA: Diagnosis not present

## 2020-07-29 DIAGNOSIS — I482 Chronic atrial fibrillation, unspecified: Secondary | ICD-10-CM | POA: Diagnosis not present

## 2020-07-29 DIAGNOSIS — Z7901 Long term (current) use of anticoagulants: Secondary | ICD-10-CM | POA: Diagnosis not present

## 2020-07-29 DIAGNOSIS — H9190 Unspecified hearing loss, unspecified ear: Secondary | ICD-10-CM | POA: Diagnosis not present

## 2020-07-29 DIAGNOSIS — Z86711 Personal history of pulmonary embolism: Secondary | ICD-10-CM | POA: Diagnosis not present

## 2020-07-29 DIAGNOSIS — I82432 Acute embolism and thrombosis of left popliteal vein: Secondary | ICD-10-CM | POA: Diagnosis not present

## 2020-07-29 DIAGNOSIS — S8011XD Contusion of right lower leg, subsequent encounter: Secondary | ICD-10-CM | POA: Diagnosis not present

## 2020-07-29 DIAGNOSIS — E1122 Type 2 diabetes mellitus with diabetic chronic kidney disease: Secondary | ICD-10-CM | POA: Diagnosis not present

## 2020-07-29 DIAGNOSIS — S41101D Unspecified open wound of right upper arm, subsequent encounter: Secondary | ICD-10-CM | POA: Diagnosis not present

## 2020-07-29 DIAGNOSIS — S21209D Unspecified open wound of unspecified back wall of thorax without penetration into thoracic cavity, subsequent encounter: Secondary | ICD-10-CM | POA: Diagnosis not present

## 2020-07-29 DIAGNOSIS — M109 Gout, unspecified: Secondary | ICD-10-CM | POA: Diagnosis not present

## 2020-07-29 DIAGNOSIS — K219 Gastro-esophageal reflux disease without esophagitis: Secondary | ICD-10-CM | POA: Diagnosis not present

## 2020-07-29 DIAGNOSIS — E113292 Type 2 diabetes mellitus with mild nonproliferative diabetic retinopathy without macular edema, left eye: Secondary | ICD-10-CM | POA: Diagnosis not present

## 2020-07-29 DIAGNOSIS — N184 Chronic kidney disease, stage 4 (severe): Secondary | ICD-10-CM | POA: Diagnosis not present

## 2020-07-29 DIAGNOSIS — I130 Hypertensive heart and chronic kidney disease with heart failure and stage 1 through stage 4 chronic kidney disease, or unspecified chronic kidney disease: Secondary | ICD-10-CM | POA: Diagnosis not present

## 2020-07-29 DIAGNOSIS — M1990 Unspecified osteoarthritis, unspecified site: Secondary | ICD-10-CM | POA: Diagnosis not present

## 2020-07-29 DIAGNOSIS — L03115 Cellulitis of right lower limb: Secondary | ICD-10-CM | POA: Diagnosis not present

## 2020-08-03 DIAGNOSIS — S21209D Unspecified open wound of unspecified back wall of thorax without penetration into thoracic cavity, subsequent encounter: Secondary | ICD-10-CM | POA: Diagnosis not present

## 2020-08-03 DIAGNOSIS — Z86711 Personal history of pulmonary embolism: Secondary | ICD-10-CM | POA: Diagnosis not present

## 2020-08-03 DIAGNOSIS — K219 Gastro-esophageal reflux disease without esophagitis: Secondary | ICD-10-CM | POA: Diagnosis not present

## 2020-08-03 DIAGNOSIS — Z7901 Long term (current) use of anticoagulants: Secondary | ICD-10-CM | POA: Diagnosis not present

## 2020-08-03 DIAGNOSIS — E039 Hypothyroidism, unspecified: Secondary | ICD-10-CM | POA: Diagnosis not present

## 2020-08-03 DIAGNOSIS — Z950 Presence of cardiac pacemaker: Secondary | ICD-10-CM | POA: Diagnosis not present

## 2020-08-03 DIAGNOSIS — N184 Chronic kidney disease, stage 4 (severe): Secondary | ICD-10-CM | POA: Diagnosis not present

## 2020-08-03 DIAGNOSIS — I82432 Acute embolism and thrombosis of left popliteal vein: Secondary | ICD-10-CM | POA: Diagnosis not present

## 2020-08-03 DIAGNOSIS — J449 Chronic obstructive pulmonary disease, unspecified: Secondary | ICD-10-CM | POA: Diagnosis not present

## 2020-08-03 DIAGNOSIS — I130 Hypertensive heart and chronic kidney disease with heart failure and stage 1 through stage 4 chronic kidney disease, or unspecified chronic kidney disease: Secondary | ICD-10-CM | POA: Diagnosis not present

## 2020-08-03 DIAGNOSIS — E113292 Type 2 diabetes mellitus with mild nonproliferative diabetic retinopathy without macular edema, left eye: Secondary | ICD-10-CM | POA: Diagnosis not present

## 2020-08-03 DIAGNOSIS — M109 Gout, unspecified: Secondary | ICD-10-CM | POA: Diagnosis not present

## 2020-08-03 DIAGNOSIS — Z7951 Long term (current) use of inhaled steroids: Secondary | ICD-10-CM | POA: Diagnosis not present

## 2020-08-03 DIAGNOSIS — M1990 Unspecified osteoarthritis, unspecified site: Secondary | ICD-10-CM | POA: Diagnosis not present

## 2020-08-03 DIAGNOSIS — I5032 Chronic diastolic (congestive) heart failure: Secondary | ICD-10-CM | POA: Diagnosis not present

## 2020-08-03 DIAGNOSIS — Z87440 Personal history of urinary (tract) infections: Secondary | ICD-10-CM | POA: Diagnosis not present

## 2020-08-03 DIAGNOSIS — H9190 Unspecified hearing loss, unspecified ear: Secondary | ICD-10-CM | POA: Diagnosis not present

## 2020-08-03 DIAGNOSIS — E785 Hyperlipidemia, unspecified: Secondary | ICD-10-CM | POA: Diagnosis not present

## 2020-08-03 DIAGNOSIS — S8011XD Contusion of right lower leg, subsequent encounter: Secondary | ICD-10-CM | POA: Diagnosis not present

## 2020-08-03 DIAGNOSIS — I482 Chronic atrial fibrillation, unspecified: Secondary | ICD-10-CM | POA: Diagnosis not present

## 2020-08-03 DIAGNOSIS — S41101D Unspecified open wound of right upper arm, subsequent encounter: Secondary | ICD-10-CM | POA: Diagnosis not present

## 2020-08-03 DIAGNOSIS — L03115 Cellulitis of right lower limb: Secondary | ICD-10-CM | POA: Diagnosis not present

## 2020-08-03 DIAGNOSIS — E1122 Type 2 diabetes mellitus with diabetic chronic kidney disease: Secondary | ICD-10-CM | POA: Diagnosis not present

## 2020-08-03 DIAGNOSIS — Z9181 History of falling: Secondary | ICD-10-CM | POA: Diagnosis not present

## 2020-08-04 DIAGNOSIS — N184 Chronic kidney disease, stage 4 (severe): Secondary | ICD-10-CM | POA: Diagnosis not present

## 2020-08-04 DIAGNOSIS — L03115 Cellulitis of right lower limb: Secondary | ICD-10-CM | POA: Diagnosis not present

## 2020-08-04 DIAGNOSIS — I509 Heart failure, unspecified: Secondary | ICD-10-CM | POA: Diagnosis not present

## 2020-08-04 DIAGNOSIS — I82409 Acute embolism and thrombosis of unspecified deep veins of unspecified lower extremity: Secondary | ICD-10-CM | POA: Diagnosis not present

## 2020-08-04 DIAGNOSIS — I130 Hypertensive heart and chronic kidney disease with heart failure and stage 1 through stage 4 chronic kidney disease, or unspecified chronic kidney disease: Secondary | ICD-10-CM | POA: Diagnosis not present

## 2020-08-05 DIAGNOSIS — S8011XD Contusion of right lower leg, subsequent encounter: Secondary | ICD-10-CM | POA: Diagnosis not present

## 2020-08-05 DIAGNOSIS — J449 Chronic obstructive pulmonary disease, unspecified: Secondary | ICD-10-CM | POA: Diagnosis not present

## 2020-08-05 DIAGNOSIS — E113292 Type 2 diabetes mellitus with mild nonproliferative diabetic retinopathy without macular edema, left eye: Secondary | ICD-10-CM | POA: Diagnosis not present

## 2020-08-05 DIAGNOSIS — M109 Gout, unspecified: Secondary | ICD-10-CM | POA: Diagnosis not present

## 2020-08-05 DIAGNOSIS — M1990 Unspecified osteoarthritis, unspecified site: Secondary | ICD-10-CM | POA: Diagnosis not present

## 2020-08-05 DIAGNOSIS — E785 Hyperlipidemia, unspecified: Secondary | ICD-10-CM | POA: Diagnosis not present

## 2020-08-05 DIAGNOSIS — Z87440 Personal history of urinary (tract) infections: Secondary | ICD-10-CM | POA: Diagnosis not present

## 2020-08-05 DIAGNOSIS — N184 Chronic kidney disease, stage 4 (severe): Secondary | ICD-10-CM | POA: Diagnosis not present

## 2020-08-05 DIAGNOSIS — Z7901 Long term (current) use of anticoagulants: Secondary | ICD-10-CM | POA: Diagnosis not present

## 2020-08-05 DIAGNOSIS — S41101D Unspecified open wound of right upper arm, subsequent encounter: Secondary | ICD-10-CM | POA: Diagnosis not present

## 2020-08-05 DIAGNOSIS — I130 Hypertensive heart and chronic kidney disease with heart failure and stage 1 through stage 4 chronic kidney disease, or unspecified chronic kidney disease: Secondary | ICD-10-CM | POA: Diagnosis not present

## 2020-08-05 DIAGNOSIS — Z9181 History of falling: Secondary | ICD-10-CM | POA: Diagnosis not present

## 2020-08-05 DIAGNOSIS — H9190 Unspecified hearing loss, unspecified ear: Secondary | ICD-10-CM | POA: Diagnosis not present

## 2020-08-05 DIAGNOSIS — I5032 Chronic diastolic (congestive) heart failure: Secondary | ICD-10-CM | POA: Diagnosis not present

## 2020-08-05 DIAGNOSIS — I82432 Acute embolism and thrombosis of left popliteal vein: Secondary | ICD-10-CM | POA: Diagnosis not present

## 2020-08-05 DIAGNOSIS — S21209D Unspecified open wound of unspecified back wall of thorax without penetration into thoracic cavity, subsequent encounter: Secondary | ICD-10-CM | POA: Diagnosis not present

## 2020-08-05 DIAGNOSIS — L03115 Cellulitis of right lower limb: Secondary | ICD-10-CM | POA: Diagnosis not present

## 2020-08-05 DIAGNOSIS — E039 Hypothyroidism, unspecified: Secondary | ICD-10-CM | POA: Diagnosis not present

## 2020-08-05 DIAGNOSIS — I482 Chronic atrial fibrillation, unspecified: Secondary | ICD-10-CM | POA: Diagnosis not present

## 2020-08-05 DIAGNOSIS — Z86711 Personal history of pulmonary embolism: Secondary | ICD-10-CM | POA: Diagnosis not present

## 2020-08-05 DIAGNOSIS — Z7951 Long term (current) use of inhaled steroids: Secondary | ICD-10-CM | POA: Diagnosis not present

## 2020-08-05 DIAGNOSIS — K219 Gastro-esophageal reflux disease without esophagitis: Secondary | ICD-10-CM | POA: Diagnosis not present

## 2020-08-05 DIAGNOSIS — E1122 Type 2 diabetes mellitus with diabetic chronic kidney disease: Secondary | ICD-10-CM | POA: Diagnosis not present

## 2020-08-05 DIAGNOSIS — Z950 Presence of cardiac pacemaker: Secondary | ICD-10-CM | POA: Diagnosis not present

## 2020-08-09 DIAGNOSIS — Z7901 Long term (current) use of anticoagulants: Secondary | ICD-10-CM | POA: Diagnosis not present

## 2020-08-09 DIAGNOSIS — H9190 Unspecified hearing loss, unspecified ear: Secondary | ICD-10-CM | POA: Diagnosis not present

## 2020-08-09 DIAGNOSIS — Z86711 Personal history of pulmonary embolism: Secondary | ICD-10-CM | POA: Diagnosis not present

## 2020-08-09 DIAGNOSIS — S8011XD Contusion of right lower leg, subsequent encounter: Secondary | ICD-10-CM | POA: Diagnosis not present

## 2020-08-09 DIAGNOSIS — I130 Hypertensive heart and chronic kidney disease with heart failure and stage 1 through stage 4 chronic kidney disease, or unspecified chronic kidney disease: Secondary | ICD-10-CM | POA: Diagnosis not present

## 2020-08-09 DIAGNOSIS — Z950 Presence of cardiac pacemaker: Secondary | ICD-10-CM | POA: Diagnosis not present

## 2020-08-09 DIAGNOSIS — E785 Hyperlipidemia, unspecified: Secondary | ICD-10-CM | POA: Diagnosis not present

## 2020-08-09 DIAGNOSIS — E1122 Type 2 diabetes mellitus with diabetic chronic kidney disease: Secondary | ICD-10-CM | POA: Diagnosis not present

## 2020-08-09 DIAGNOSIS — J449 Chronic obstructive pulmonary disease, unspecified: Secondary | ICD-10-CM | POA: Diagnosis not present

## 2020-08-09 DIAGNOSIS — K219 Gastro-esophageal reflux disease without esophagitis: Secondary | ICD-10-CM | POA: Diagnosis not present

## 2020-08-09 DIAGNOSIS — E113292 Type 2 diabetes mellitus with mild nonproliferative diabetic retinopathy without macular edema, left eye: Secondary | ICD-10-CM | POA: Diagnosis not present

## 2020-08-09 DIAGNOSIS — Z87440 Personal history of urinary (tract) infections: Secondary | ICD-10-CM | POA: Diagnosis not present

## 2020-08-09 DIAGNOSIS — L03115 Cellulitis of right lower limb: Secondary | ICD-10-CM | POA: Diagnosis not present

## 2020-08-09 DIAGNOSIS — Z9181 History of falling: Secondary | ICD-10-CM | POA: Diagnosis not present

## 2020-08-09 DIAGNOSIS — M1990 Unspecified osteoarthritis, unspecified site: Secondary | ICD-10-CM | POA: Diagnosis not present

## 2020-08-09 DIAGNOSIS — Z7951 Long term (current) use of inhaled steroids: Secondary | ICD-10-CM | POA: Diagnosis not present

## 2020-08-09 DIAGNOSIS — M109 Gout, unspecified: Secondary | ICD-10-CM | POA: Diagnosis not present

## 2020-08-09 DIAGNOSIS — I482 Chronic atrial fibrillation, unspecified: Secondary | ICD-10-CM | POA: Diagnosis not present

## 2020-08-09 DIAGNOSIS — S21209D Unspecified open wound of unspecified back wall of thorax without penetration into thoracic cavity, subsequent encounter: Secondary | ICD-10-CM | POA: Diagnosis not present

## 2020-08-09 DIAGNOSIS — I5032 Chronic diastolic (congestive) heart failure: Secondary | ICD-10-CM | POA: Diagnosis not present

## 2020-08-09 DIAGNOSIS — S41101D Unspecified open wound of right upper arm, subsequent encounter: Secondary | ICD-10-CM | POA: Diagnosis not present

## 2020-08-09 DIAGNOSIS — I82432 Acute embolism and thrombosis of left popliteal vein: Secondary | ICD-10-CM | POA: Diagnosis not present

## 2020-08-09 DIAGNOSIS — E039 Hypothyroidism, unspecified: Secondary | ICD-10-CM | POA: Diagnosis not present

## 2020-08-09 DIAGNOSIS — N184 Chronic kidney disease, stage 4 (severe): Secondary | ICD-10-CM | POA: Diagnosis not present

## 2020-08-19 DIAGNOSIS — Z86711 Personal history of pulmonary embolism: Secondary | ICD-10-CM | POA: Diagnosis not present

## 2020-08-19 DIAGNOSIS — I82432 Acute embolism and thrombosis of left popliteal vein: Secondary | ICD-10-CM | POA: Diagnosis not present

## 2020-08-19 DIAGNOSIS — S8011XD Contusion of right lower leg, subsequent encounter: Secondary | ICD-10-CM | POA: Diagnosis not present

## 2020-08-19 DIAGNOSIS — M1990 Unspecified osteoarthritis, unspecified site: Secondary | ICD-10-CM | POA: Diagnosis not present

## 2020-08-19 DIAGNOSIS — J449 Chronic obstructive pulmonary disease, unspecified: Secondary | ICD-10-CM | POA: Diagnosis not present

## 2020-08-19 DIAGNOSIS — I5032 Chronic diastolic (congestive) heart failure: Secondary | ICD-10-CM | POA: Diagnosis not present

## 2020-08-19 DIAGNOSIS — E785 Hyperlipidemia, unspecified: Secondary | ICD-10-CM | POA: Diagnosis not present

## 2020-08-19 DIAGNOSIS — I130 Hypertensive heart and chronic kidney disease with heart failure and stage 1 through stage 4 chronic kidney disease, or unspecified chronic kidney disease: Secondary | ICD-10-CM | POA: Diagnosis not present

## 2020-08-19 DIAGNOSIS — H9190 Unspecified hearing loss, unspecified ear: Secondary | ICD-10-CM | POA: Diagnosis not present

## 2020-08-19 DIAGNOSIS — I482 Chronic atrial fibrillation, unspecified: Secondary | ICD-10-CM | POA: Diagnosis not present

## 2020-08-19 DIAGNOSIS — Z9181 History of falling: Secondary | ICD-10-CM | POA: Diagnosis not present

## 2020-08-19 DIAGNOSIS — N184 Chronic kidney disease, stage 4 (severe): Secondary | ICD-10-CM | POA: Diagnosis not present

## 2020-08-19 DIAGNOSIS — E1122 Type 2 diabetes mellitus with diabetic chronic kidney disease: Secondary | ICD-10-CM | POA: Diagnosis not present

## 2020-08-19 DIAGNOSIS — S41101D Unspecified open wound of right upper arm, subsequent encounter: Secondary | ICD-10-CM | POA: Diagnosis not present

## 2020-08-19 DIAGNOSIS — K219 Gastro-esophageal reflux disease without esophagitis: Secondary | ICD-10-CM | POA: Diagnosis not present

## 2020-08-19 DIAGNOSIS — Z87440 Personal history of urinary (tract) infections: Secondary | ICD-10-CM | POA: Diagnosis not present

## 2020-08-19 DIAGNOSIS — L03115 Cellulitis of right lower limb: Secondary | ICD-10-CM | POA: Diagnosis not present

## 2020-08-19 DIAGNOSIS — E113292 Type 2 diabetes mellitus with mild nonproliferative diabetic retinopathy without macular edema, left eye: Secondary | ICD-10-CM | POA: Diagnosis not present

## 2020-08-19 DIAGNOSIS — Z7951 Long term (current) use of inhaled steroids: Secondary | ICD-10-CM | POA: Diagnosis not present

## 2020-08-19 DIAGNOSIS — E039 Hypothyroidism, unspecified: Secondary | ICD-10-CM | POA: Diagnosis not present

## 2020-08-19 DIAGNOSIS — M109 Gout, unspecified: Secondary | ICD-10-CM | POA: Diagnosis not present

## 2020-08-19 DIAGNOSIS — Z950 Presence of cardiac pacemaker: Secondary | ICD-10-CM | POA: Diagnosis not present

## 2020-08-19 DIAGNOSIS — Z7901 Long term (current) use of anticoagulants: Secondary | ICD-10-CM | POA: Diagnosis not present

## 2020-08-19 DIAGNOSIS — S21209D Unspecified open wound of unspecified back wall of thorax without penetration into thoracic cavity, subsequent encounter: Secondary | ICD-10-CM | POA: Diagnosis not present

## 2020-08-26 DIAGNOSIS — I5032 Chronic diastolic (congestive) heart failure: Secondary | ICD-10-CM | POA: Diagnosis not present

## 2020-08-26 DIAGNOSIS — S8011XD Contusion of right lower leg, subsequent encounter: Secondary | ICD-10-CM | POA: Diagnosis not present

## 2020-08-26 DIAGNOSIS — M109 Gout, unspecified: Secondary | ICD-10-CM | POA: Diagnosis not present

## 2020-08-26 DIAGNOSIS — E1122 Type 2 diabetes mellitus with diabetic chronic kidney disease: Secondary | ICD-10-CM | POA: Diagnosis not present

## 2020-08-26 DIAGNOSIS — L03115 Cellulitis of right lower limb: Secondary | ICD-10-CM | POA: Diagnosis not present

## 2020-08-26 DIAGNOSIS — Z7901 Long term (current) use of anticoagulants: Secondary | ICD-10-CM | POA: Diagnosis not present

## 2020-08-26 DIAGNOSIS — Z9181 History of falling: Secondary | ICD-10-CM | POA: Diagnosis not present

## 2020-08-26 DIAGNOSIS — N184 Chronic kidney disease, stage 4 (severe): Secondary | ICD-10-CM | POA: Diagnosis not present

## 2020-08-26 DIAGNOSIS — E785 Hyperlipidemia, unspecified: Secondary | ICD-10-CM | POA: Diagnosis not present

## 2020-08-26 DIAGNOSIS — Z7951 Long term (current) use of inhaled steroids: Secondary | ICD-10-CM | POA: Diagnosis not present

## 2020-08-26 DIAGNOSIS — S41101D Unspecified open wound of right upper arm, subsequent encounter: Secondary | ICD-10-CM | POA: Diagnosis not present

## 2020-08-26 DIAGNOSIS — I482 Chronic atrial fibrillation, unspecified: Secondary | ICD-10-CM | POA: Diagnosis not present

## 2020-08-26 DIAGNOSIS — E039 Hypothyroidism, unspecified: Secondary | ICD-10-CM | POA: Diagnosis not present

## 2020-08-26 DIAGNOSIS — M1990 Unspecified osteoarthritis, unspecified site: Secondary | ICD-10-CM | POA: Diagnosis not present

## 2020-08-26 DIAGNOSIS — Z950 Presence of cardiac pacemaker: Secondary | ICD-10-CM | POA: Diagnosis not present

## 2020-08-26 DIAGNOSIS — Z86711 Personal history of pulmonary embolism: Secondary | ICD-10-CM | POA: Diagnosis not present

## 2020-08-26 DIAGNOSIS — E113292 Type 2 diabetes mellitus with mild nonproliferative diabetic retinopathy without macular edema, left eye: Secondary | ICD-10-CM | POA: Diagnosis not present

## 2020-08-26 DIAGNOSIS — I82432 Acute embolism and thrombosis of left popliteal vein: Secondary | ICD-10-CM | POA: Diagnosis not present

## 2020-08-26 DIAGNOSIS — H9190 Unspecified hearing loss, unspecified ear: Secondary | ICD-10-CM | POA: Diagnosis not present

## 2020-08-26 DIAGNOSIS — S21209D Unspecified open wound of unspecified back wall of thorax without penetration into thoracic cavity, subsequent encounter: Secondary | ICD-10-CM | POA: Diagnosis not present

## 2020-08-26 DIAGNOSIS — Z87440 Personal history of urinary (tract) infections: Secondary | ICD-10-CM | POA: Diagnosis not present

## 2020-08-26 DIAGNOSIS — I130 Hypertensive heart and chronic kidney disease with heart failure and stage 1 through stage 4 chronic kidney disease, or unspecified chronic kidney disease: Secondary | ICD-10-CM | POA: Diagnosis not present

## 2020-08-26 DIAGNOSIS — K219 Gastro-esophageal reflux disease without esophagitis: Secondary | ICD-10-CM | POA: Diagnosis not present

## 2020-08-26 DIAGNOSIS — J449 Chronic obstructive pulmonary disease, unspecified: Secondary | ICD-10-CM | POA: Diagnosis not present

## 2020-09-07 DIAGNOSIS — L03115 Cellulitis of right lower limb: Secondary | ICD-10-CM | POA: Diagnosis not present

## 2020-09-07 DIAGNOSIS — S8011XD Contusion of right lower leg, subsequent encounter: Secondary | ICD-10-CM | POA: Diagnosis not present

## 2020-09-07 DIAGNOSIS — I482 Chronic atrial fibrillation, unspecified: Secondary | ICD-10-CM | POA: Diagnosis not present

## 2020-09-07 DIAGNOSIS — E1122 Type 2 diabetes mellitus with diabetic chronic kidney disease: Secondary | ICD-10-CM | POA: Diagnosis not present

## 2020-09-07 DIAGNOSIS — Z950 Presence of cardiac pacemaker: Secondary | ICD-10-CM | POA: Diagnosis not present

## 2020-09-07 DIAGNOSIS — Z7951 Long term (current) use of inhaled steroids: Secondary | ICD-10-CM | POA: Diagnosis not present

## 2020-09-07 DIAGNOSIS — Z86711 Personal history of pulmonary embolism: Secondary | ICD-10-CM | POA: Diagnosis not present

## 2020-09-07 DIAGNOSIS — S21209D Unspecified open wound of unspecified back wall of thorax without penetration into thoracic cavity, subsequent encounter: Secondary | ICD-10-CM | POA: Diagnosis not present

## 2020-09-07 DIAGNOSIS — Z9181 History of falling: Secondary | ICD-10-CM | POA: Diagnosis not present

## 2020-09-07 DIAGNOSIS — I130 Hypertensive heart and chronic kidney disease with heart failure and stage 1 through stage 4 chronic kidney disease, or unspecified chronic kidney disease: Secondary | ICD-10-CM | POA: Diagnosis not present

## 2020-09-07 DIAGNOSIS — H9190 Unspecified hearing loss, unspecified ear: Secondary | ICD-10-CM | POA: Diagnosis not present

## 2020-09-07 DIAGNOSIS — M109 Gout, unspecified: Secondary | ICD-10-CM | POA: Diagnosis not present

## 2020-09-07 DIAGNOSIS — S41101D Unspecified open wound of right upper arm, subsequent encounter: Secondary | ICD-10-CM | POA: Diagnosis not present

## 2020-09-07 DIAGNOSIS — E039 Hypothyroidism, unspecified: Secondary | ICD-10-CM | POA: Diagnosis not present

## 2020-09-07 DIAGNOSIS — E113292 Type 2 diabetes mellitus with mild nonproliferative diabetic retinopathy without macular edema, left eye: Secondary | ICD-10-CM | POA: Diagnosis not present

## 2020-09-07 DIAGNOSIS — Z7901 Long term (current) use of anticoagulants: Secondary | ICD-10-CM | POA: Diagnosis not present

## 2020-09-07 DIAGNOSIS — I82432 Acute embolism and thrombosis of left popliteal vein: Secondary | ICD-10-CM | POA: Diagnosis not present

## 2020-09-07 DIAGNOSIS — K219 Gastro-esophageal reflux disease without esophagitis: Secondary | ICD-10-CM | POA: Diagnosis not present

## 2020-09-07 DIAGNOSIS — Z87440 Personal history of urinary (tract) infections: Secondary | ICD-10-CM | POA: Diagnosis not present

## 2020-09-07 DIAGNOSIS — I5032 Chronic diastolic (congestive) heart failure: Secondary | ICD-10-CM | POA: Diagnosis not present

## 2020-09-07 DIAGNOSIS — N184 Chronic kidney disease, stage 4 (severe): Secondary | ICD-10-CM | POA: Diagnosis not present

## 2020-09-07 DIAGNOSIS — M1990 Unspecified osteoarthritis, unspecified site: Secondary | ICD-10-CM | POA: Diagnosis not present

## 2020-09-07 DIAGNOSIS — E785 Hyperlipidemia, unspecified: Secondary | ICD-10-CM | POA: Diagnosis not present

## 2020-09-07 DIAGNOSIS — J449 Chronic obstructive pulmonary disease, unspecified: Secondary | ICD-10-CM | POA: Diagnosis not present

## 2020-09-08 DIAGNOSIS — N183 Chronic kidney disease, stage 3 unspecified: Secondary | ICD-10-CM | POA: Diagnosis not present

## 2020-09-08 DIAGNOSIS — R0600 Dyspnea, unspecified: Secondary | ICD-10-CM | POA: Diagnosis not present

## 2020-09-08 DIAGNOSIS — E1122 Type 2 diabetes mellitus with diabetic chronic kidney disease: Secondary | ICD-10-CM | POA: Diagnosis not present

## 2020-09-08 DIAGNOSIS — E039 Hypothyroidism, unspecified: Secondary | ICD-10-CM | POA: Diagnosis not present

## 2020-09-08 DIAGNOSIS — E785 Hyperlipidemia, unspecified: Secondary | ICD-10-CM | POA: Diagnosis not present

## 2020-09-08 DIAGNOSIS — D649 Anemia, unspecified: Secondary | ICD-10-CM | POA: Diagnosis not present

## 2020-09-08 DIAGNOSIS — E1169 Type 2 diabetes mellitus with other specified complication: Secondary | ICD-10-CM | POA: Diagnosis not present

## 2020-09-13 DIAGNOSIS — M47816 Spondylosis without myelopathy or radiculopathy, lumbar region: Secondary | ICD-10-CM | POA: Diagnosis not present

## 2020-09-13 DIAGNOSIS — R748 Abnormal levels of other serum enzymes: Secondary | ICD-10-CM | POA: Diagnosis not present

## 2020-09-13 DIAGNOSIS — M4186 Other forms of scoliosis, lumbar region: Secondary | ICD-10-CM | POA: Diagnosis not present

## 2020-09-13 DIAGNOSIS — K5730 Diverticulosis of large intestine without perforation or abscess without bleeding: Secondary | ICD-10-CM | POA: Diagnosis not present

## 2020-09-13 DIAGNOSIS — R109 Unspecified abdominal pain: Secondary | ICD-10-CM | POA: Diagnosis not present

## 2020-09-14 DIAGNOSIS — R109 Unspecified abdominal pain: Secondary | ICD-10-CM | POA: Diagnosis not present

## 2020-11-22 DIAGNOSIS — I129 Hypertensive chronic kidney disease with stage 1 through stage 4 chronic kidney disease, or unspecified chronic kidney disease: Secondary | ICD-10-CM | POA: Diagnosis not present

## 2020-11-22 DIAGNOSIS — N184 Chronic kidney disease, stage 4 (severe): Secondary | ICD-10-CM | POA: Diagnosis not present

## 2020-11-22 DIAGNOSIS — N2581 Secondary hyperparathyroidism of renal origin: Secondary | ICD-10-CM | POA: Diagnosis not present

## 2020-11-23 DIAGNOSIS — E039 Hypothyroidism, unspecified: Secondary | ICD-10-CM | POA: Diagnosis not present

## 2020-11-23 DIAGNOSIS — I130 Hypertensive heart and chronic kidney disease with heart failure and stage 1 through stage 4 chronic kidney disease, or unspecified chronic kidney disease: Secondary | ICD-10-CM | POA: Diagnosis not present

## 2020-11-23 DIAGNOSIS — E785 Hyperlipidemia, unspecified: Secondary | ICD-10-CM | POA: Diagnosis not present

## 2020-11-23 DIAGNOSIS — D649 Anemia, unspecified: Secondary | ICD-10-CM | POA: Diagnosis not present

## 2020-11-23 DIAGNOSIS — E1122 Type 2 diabetes mellitus with diabetic chronic kidney disease: Secondary | ICD-10-CM | POA: Diagnosis not present

## 2020-11-23 DIAGNOSIS — M109 Gout, unspecified: Secondary | ICD-10-CM | POA: Diagnosis not present

## 2020-11-23 DIAGNOSIS — I4891 Unspecified atrial fibrillation: Secondary | ICD-10-CM | POA: Diagnosis not present

## 2020-11-23 DIAGNOSIS — I509 Heart failure, unspecified: Secondary | ICD-10-CM | POA: Diagnosis not present

## 2020-11-23 DIAGNOSIS — R0600 Dyspnea, unspecified: Secondary | ICD-10-CM | POA: Diagnosis not present

## 2020-11-23 DIAGNOSIS — N184 Chronic kidney disease, stage 4 (severe): Secondary | ICD-10-CM | POA: Diagnosis not present

## 2020-11-23 DIAGNOSIS — N183 Chronic kidney disease, stage 3 unspecified: Secondary | ICD-10-CM | POA: Diagnosis not present

## 2020-11-23 DIAGNOSIS — E1169 Type 2 diabetes mellitus with other specified complication: Secondary | ICD-10-CM | POA: Diagnosis not present

## 2020-12-16 DIAGNOSIS — I739 Peripheral vascular disease, unspecified: Secondary | ICD-10-CM | POA: Diagnosis not present

## 2020-12-16 DIAGNOSIS — L03119 Cellulitis of unspecified part of limb: Secondary | ICD-10-CM | POA: Diagnosis not present

## 2021-02-09 DIAGNOSIS — B9689 Other specified bacterial agents as the cause of diseases classified elsewhere: Secondary | ICD-10-CM | POA: Diagnosis not present

## 2021-02-09 DIAGNOSIS — J208 Acute bronchitis due to other specified organisms: Secondary | ICD-10-CM | POA: Diagnosis not present

## 2021-02-23 DIAGNOSIS — M109 Gout, unspecified: Secondary | ICD-10-CM | POA: Diagnosis not present

## 2021-02-23 DIAGNOSIS — N184 Chronic kidney disease, stage 4 (severe): Secondary | ICD-10-CM | POA: Diagnosis not present

## 2021-02-23 DIAGNOSIS — I130 Hypertensive heart and chronic kidney disease with heart failure and stage 1 through stage 4 chronic kidney disease, or unspecified chronic kidney disease: Secondary | ICD-10-CM | POA: Diagnosis not present

## 2021-02-23 DIAGNOSIS — I509 Heart failure, unspecified: Secondary | ICD-10-CM | POA: Diagnosis not present

## 2021-02-23 DIAGNOSIS — E1169 Type 2 diabetes mellitus with other specified complication: Secondary | ICD-10-CM | POA: Diagnosis not present

## 2021-02-23 DIAGNOSIS — E785 Hyperlipidemia, unspecified: Secondary | ICD-10-CM | POA: Diagnosis not present

## 2021-02-23 DIAGNOSIS — E039 Hypothyroidism, unspecified: Secondary | ICD-10-CM | POA: Diagnosis not present

## 2021-02-23 DIAGNOSIS — N183 Chronic kidney disease, stage 3 unspecified: Secondary | ICD-10-CM | POA: Diagnosis not present

## 2021-02-23 DIAGNOSIS — D649 Anemia, unspecified: Secondary | ICD-10-CM | POA: Diagnosis not present

## 2021-02-23 DIAGNOSIS — E1122 Type 2 diabetes mellitus with diabetic chronic kidney disease: Secondary | ICD-10-CM | POA: Diagnosis not present

## 2021-02-23 DIAGNOSIS — I4891 Unspecified atrial fibrillation: Secondary | ICD-10-CM | POA: Diagnosis not present

## 2021-02-28 DIAGNOSIS — G9341 Metabolic encephalopathy: Secondary | ICD-10-CM | POA: Diagnosis not present

## 2021-02-28 DIAGNOSIS — D72829 Elevated white blood cell count, unspecified: Secondary | ICD-10-CM | POA: Diagnosis not present

## 2021-02-28 DIAGNOSIS — Z79899 Other long term (current) drug therapy: Secondary | ICD-10-CM | POA: Diagnosis not present

## 2021-02-28 DIAGNOSIS — R7401 Elevation of levels of liver transaminase levels: Secondary | ICD-10-CM | POA: Diagnosis not present

## 2021-02-28 DIAGNOSIS — Z20822 Contact with and (suspected) exposure to covid-19: Secondary | ICD-10-CM | POA: Diagnosis not present

## 2021-02-28 DIAGNOSIS — J439 Emphysema, unspecified: Secondary | ICD-10-CM | POA: Diagnosis not present

## 2021-02-28 DIAGNOSIS — R1011 Right upper quadrant pain: Secondary | ICD-10-CM | POA: Diagnosis not present

## 2021-02-28 DIAGNOSIS — I2510 Atherosclerotic heart disease of native coronary artery without angina pectoris: Secondary | ICD-10-CM | POA: Diagnosis not present

## 2021-02-28 DIAGNOSIS — Z452 Encounter for adjustment and management of vascular access device: Secondary | ICD-10-CM | POA: Diagnosis not present

## 2021-02-28 DIAGNOSIS — Z9049 Acquired absence of other specified parts of digestive tract: Secondary | ICD-10-CM | POA: Diagnosis not present

## 2021-02-28 DIAGNOSIS — K5730 Diverticulosis of large intestine without perforation or abscess without bleeding: Secondary | ICD-10-CM | POA: Diagnosis not present

## 2021-02-28 DIAGNOSIS — A4181 Sepsis due to Enterococcus: Secondary | ICD-10-CM | POA: Diagnosis not present

## 2021-02-28 DIAGNOSIS — R7989 Other specified abnormal findings of blood chemistry: Secondary | ICD-10-CM | POA: Diagnosis not present

## 2021-02-28 DIAGNOSIS — I517 Cardiomegaly: Secondary | ICD-10-CM | POA: Diagnosis not present

## 2021-02-28 DIAGNOSIS — R110 Nausea: Secondary | ICD-10-CM | POA: Diagnosis not present

## 2021-02-28 DIAGNOSIS — Z86711 Personal history of pulmonary embolism: Secondary | ICD-10-CM | POA: Diagnosis not present

## 2021-02-28 DIAGNOSIS — D696 Thrombocytopenia, unspecified: Secondary | ICD-10-CM | POA: Diagnosis not present

## 2021-02-28 DIAGNOSIS — Z7901 Long term (current) use of anticoagulants: Secondary | ICD-10-CM | POA: Diagnosis not present

## 2021-02-28 DIAGNOSIS — M1990 Unspecified osteoarthritis, unspecified site: Secondary | ICD-10-CM | POA: Diagnosis not present

## 2021-02-28 DIAGNOSIS — Z792 Long term (current) use of antibiotics: Secondary | ICD-10-CM | POA: Diagnosis not present

## 2021-02-28 DIAGNOSIS — M419 Scoliosis, unspecified: Secondary | ICD-10-CM | POA: Diagnosis not present

## 2021-02-28 DIAGNOSIS — A4152 Sepsis due to Pseudomonas: Secondary | ICD-10-CM | POA: Diagnosis not present

## 2021-02-28 DIAGNOSIS — N184 Chronic kidney disease, stage 4 (severe): Secondary | ICD-10-CM | POA: Diagnosis not present

## 2021-02-28 DIAGNOSIS — N390 Urinary tract infection, site not specified: Secondary | ICD-10-CM | POA: Diagnosis not present

## 2021-02-28 DIAGNOSIS — E785 Hyperlipidemia, unspecified: Secondary | ICD-10-CM | POA: Diagnosis not present

## 2021-02-28 DIAGNOSIS — R6520 Severe sepsis without septic shock: Secondary | ICD-10-CM | POA: Diagnosis not present

## 2021-02-28 DIAGNOSIS — Z951 Presence of aortocoronary bypass graft: Secondary | ICD-10-CM | POA: Diagnosis not present

## 2021-02-28 DIAGNOSIS — N281 Cyst of kidney, acquired: Secondary | ICD-10-CM | POA: Diagnosis not present

## 2021-02-28 DIAGNOSIS — I4891 Unspecified atrial fibrillation: Secondary | ICD-10-CM | POA: Diagnosis not present

## 2021-02-28 DIAGNOSIS — R1110 Vomiting, unspecified: Secondary | ICD-10-CM | POA: Diagnosis not present

## 2021-02-28 DIAGNOSIS — K769 Liver disease, unspecified: Secondary | ICD-10-CM | POA: Diagnosis not present

## 2021-02-28 DIAGNOSIS — I700 Atherosclerosis of aorta: Secondary | ICD-10-CM | POA: Diagnosis not present

## 2021-02-28 DIAGNOSIS — B9789 Other viral agents as the cause of diseases classified elsewhere: Secondary | ICD-10-CM | POA: Diagnosis not present

## 2021-02-28 DIAGNOSIS — R109 Unspecified abdominal pain: Secondary | ICD-10-CM | POA: Diagnosis not present

## 2021-02-28 DIAGNOSIS — I5032 Chronic diastolic (congestive) heart failure: Secondary | ICD-10-CM | POA: Diagnosis not present

## 2021-02-28 DIAGNOSIS — J449 Chronic obstructive pulmonary disease, unspecified: Secondary | ICD-10-CM | POA: Diagnosis not present

## 2021-02-28 DIAGNOSIS — I130 Hypertensive heart and chronic kidney disease with heart failure and stage 1 through stage 4 chronic kidney disease, or unspecified chronic kidney disease: Secondary | ICD-10-CM | POA: Diagnosis not present

## 2021-02-28 DIAGNOSIS — R112 Nausea with vomiting, unspecified: Secondary | ICD-10-CM | POA: Diagnosis not present

## 2021-02-28 DIAGNOSIS — I129 Hypertensive chronic kidney disease with stage 1 through stage 4 chronic kidney disease, or unspecified chronic kidney disease: Secondary | ICD-10-CM | POA: Diagnosis not present

## 2021-02-28 DIAGNOSIS — B952 Enterococcus as the cause of diseases classified elsewhere: Secondary | ICD-10-CM | POA: Diagnosis not present

## 2021-02-28 DIAGNOSIS — N189 Chronic kidney disease, unspecified: Secondary | ICD-10-CM | POA: Diagnosis not present

## 2021-03-05 DIAGNOSIS — I5032 Chronic diastolic (congestive) heart failure: Secondary | ICD-10-CM | POA: Diagnosis not present

## 2021-03-05 DIAGNOSIS — Z452 Encounter for adjustment and management of vascular access device: Secondary | ICD-10-CM | POA: Diagnosis not present

## 2021-03-05 DIAGNOSIS — M1990 Unspecified osteoarthritis, unspecified site: Secondary | ICD-10-CM | POA: Diagnosis not present

## 2021-03-05 DIAGNOSIS — E039 Hypothyroidism, unspecified: Secondary | ICD-10-CM | POA: Diagnosis not present

## 2021-03-05 DIAGNOSIS — G9341 Metabolic encephalopathy: Secondary | ICD-10-CM | POA: Diagnosis not present

## 2021-03-05 DIAGNOSIS — D696 Thrombocytopenia, unspecified: Secondary | ICD-10-CM | POA: Diagnosis not present

## 2021-03-05 DIAGNOSIS — E1122 Type 2 diabetes mellitus with diabetic chronic kidney disease: Secondary | ICD-10-CM | POA: Diagnosis not present

## 2021-03-05 DIAGNOSIS — J449 Chronic obstructive pulmonary disease, unspecified: Secondary | ICD-10-CM | POA: Diagnosis not present

## 2021-03-05 DIAGNOSIS — B965 Pseudomonas (aeruginosa) (mallei) (pseudomallei) as the cause of diseases classified elsewhere: Secondary | ICD-10-CM | POA: Diagnosis not present

## 2021-03-05 DIAGNOSIS — N184 Chronic kidney disease, stage 4 (severe): Secondary | ICD-10-CM | POA: Diagnosis not present

## 2021-03-05 DIAGNOSIS — I2510 Atherosclerotic heart disease of native coronary artery without angina pectoris: Secondary | ICD-10-CM | POA: Diagnosis not present

## 2021-03-05 DIAGNOSIS — A4181 Sepsis due to Enterococcus: Secondary | ICD-10-CM | POA: Diagnosis not present

## 2021-03-05 DIAGNOSIS — E785 Hyperlipidemia, unspecified: Secondary | ICD-10-CM | POA: Diagnosis not present

## 2021-03-05 DIAGNOSIS — I4891 Unspecified atrial fibrillation: Secondary | ICD-10-CM | POA: Diagnosis not present

## 2021-03-05 DIAGNOSIS — N390 Urinary tract infection, site not specified: Secondary | ICD-10-CM | POA: Diagnosis not present

## 2021-03-05 DIAGNOSIS — I130 Hypertensive heart and chronic kidney disease with heart failure and stage 1 through stage 4 chronic kidney disease, or unspecified chronic kidney disease: Secondary | ICD-10-CM | POA: Diagnosis not present

## 2021-03-05 DIAGNOSIS — Z792 Long term (current) use of antibiotics: Secondary | ICD-10-CM | POA: Diagnosis not present

## 2021-03-05 DIAGNOSIS — Z7901 Long term (current) use of anticoagulants: Secondary | ICD-10-CM | POA: Diagnosis not present

## 2021-03-05 DIAGNOSIS — A419 Sepsis, unspecified organism: Secondary | ICD-10-CM | POA: Diagnosis not present

## 2021-03-05 DIAGNOSIS — B348 Other viral infections of unspecified site: Secondary | ICD-10-CM | POA: Diagnosis not present

## 2021-03-05 DIAGNOSIS — M109 Gout, unspecified: Secondary | ICD-10-CM | POA: Diagnosis not present

## 2021-03-05 DIAGNOSIS — K8020 Calculus of gallbladder without cholecystitis without obstruction: Secondary | ICD-10-CM | POA: Diagnosis not present

## 2021-03-07 DIAGNOSIS — M109 Gout, unspecified: Secondary | ICD-10-CM | POA: Diagnosis not present

## 2021-03-07 DIAGNOSIS — E785 Hyperlipidemia, unspecified: Secondary | ICD-10-CM | POA: Diagnosis not present

## 2021-03-07 DIAGNOSIS — D696 Thrombocytopenia, unspecified: Secondary | ICD-10-CM | POA: Diagnosis not present

## 2021-03-07 DIAGNOSIS — B965 Pseudomonas (aeruginosa) (mallei) (pseudomallei) as the cause of diseases classified elsewhere: Secondary | ICD-10-CM | POA: Diagnosis not present

## 2021-03-07 DIAGNOSIS — Z452 Encounter for adjustment and management of vascular access device: Secondary | ICD-10-CM | POA: Diagnosis not present

## 2021-03-07 DIAGNOSIS — Z792 Long term (current) use of antibiotics: Secondary | ICD-10-CM | POA: Diagnosis not present

## 2021-03-07 DIAGNOSIS — N390 Urinary tract infection, site not specified: Secondary | ICD-10-CM | POA: Diagnosis not present

## 2021-03-07 DIAGNOSIS — N184 Chronic kidney disease, stage 4 (severe): Secondary | ICD-10-CM | POA: Diagnosis not present

## 2021-03-07 DIAGNOSIS — K8020 Calculus of gallbladder without cholecystitis without obstruction: Secondary | ICD-10-CM | POA: Diagnosis not present

## 2021-03-07 DIAGNOSIS — A4181 Sepsis due to Enterococcus: Secondary | ICD-10-CM | POA: Diagnosis not present

## 2021-03-07 DIAGNOSIS — Z7901 Long term (current) use of anticoagulants: Secondary | ICD-10-CM | POA: Diagnosis not present

## 2021-03-07 DIAGNOSIS — G9341 Metabolic encephalopathy: Secondary | ICD-10-CM | POA: Diagnosis not present

## 2021-03-07 DIAGNOSIS — M1990 Unspecified osteoarthritis, unspecified site: Secondary | ICD-10-CM | POA: Diagnosis not present

## 2021-03-07 DIAGNOSIS — E1122 Type 2 diabetes mellitus with diabetic chronic kidney disease: Secondary | ICD-10-CM | POA: Diagnosis not present

## 2021-03-07 DIAGNOSIS — E039 Hypothyroidism, unspecified: Secondary | ICD-10-CM | POA: Diagnosis not present

## 2021-03-07 DIAGNOSIS — I5032 Chronic diastolic (congestive) heart failure: Secondary | ICD-10-CM | POA: Diagnosis not present

## 2021-03-07 DIAGNOSIS — J449 Chronic obstructive pulmonary disease, unspecified: Secondary | ICD-10-CM | POA: Diagnosis not present

## 2021-03-07 DIAGNOSIS — I4891 Unspecified atrial fibrillation: Secondary | ICD-10-CM | POA: Diagnosis not present

## 2021-03-07 DIAGNOSIS — B348 Other viral infections of unspecified site: Secondary | ICD-10-CM | POA: Diagnosis not present

## 2021-03-07 DIAGNOSIS — I2510 Atherosclerotic heart disease of native coronary artery without angina pectoris: Secondary | ICD-10-CM | POA: Diagnosis not present

## 2021-03-07 DIAGNOSIS — I130 Hypertensive heart and chronic kidney disease with heart failure and stage 1 through stage 4 chronic kidney disease, or unspecified chronic kidney disease: Secondary | ICD-10-CM | POA: Diagnosis not present

## 2021-03-08 DIAGNOSIS — I499 Cardiac arrhythmia, unspecified: Secondary | ICD-10-CM | POA: Diagnosis not present

## 2021-03-08 DIAGNOSIS — G928 Other toxic encephalopathy: Secondary | ICD-10-CM | POA: Diagnosis not present

## 2021-03-08 DIAGNOSIS — I130 Hypertensive heart and chronic kidney disease with heart failure and stage 1 through stage 4 chronic kidney disease, or unspecified chronic kidney disease: Secondary | ICD-10-CM | POA: Diagnosis not present

## 2021-03-08 DIAGNOSIS — Z792 Long term (current) use of antibiotics: Secondary | ICD-10-CM | POA: Diagnosis not present

## 2021-03-08 DIAGNOSIS — Z79899 Other long term (current) drug therapy: Secondary | ICD-10-CM | POA: Diagnosis not present

## 2021-03-08 DIAGNOSIS — J810 Acute pulmonary edema: Secondary | ICD-10-CM | POA: Diagnosis not present

## 2021-03-08 DIAGNOSIS — N390 Urinary tract infection, site not specified: Secondary | ICD-10-CM | POA: Diagnosis not present

## 2021-03-08 DIAGNOSIS — I5033 Acute on chronic diastolic (congestive) heart failure: Secondary | ICD-10-CM | POA: Diagnosis not present

## 2021-03-08 DIAGNOSIS — E039 Hypothyroidism, unspecified: Secondary | ICD-10-CM | POA: Diagnosis not present

## 2021-03-08 DIAGNOSIS — R404 Transient alteration of awareness: Secondary | ICD-10-CM | POA: Diagnosis not present

## 2021-03-08 DIAGNOSIS — N184 Chronic kidney disease, stage 4 (severe): Secondary | ICD-10-CM | POA: Diagnosis not present

## 2021-03-08 DIAGNOSIS — A419 Sepsis, unspecified organism: Secondary | ICD-10-CM | POA: Diagnosis not present

## 2021-03-08 DIAGNOSIS — R410 Disorientation, unspecified: Secondary | ICD-10-CM | POA: Diagnosis not present

## 2021-03-08 DIAGNOSIS — I517 Cardiomegaly: Secondary | ICD-10-CM | POA: Diagnosis not present

## 2021-03-08 DIAGNOSIS — Z993 Dependence on wheelchair: Secondary | ICD-10-CM | POA: Diagnosis not present

## 2021-03-08 DIAGNOSIS — I2510 Atherosclerotic heart disease of native coronary artery without angina pectoris: Secondary | ICD-10-CM | POA: Diagnosis not present

## 2021-03-08 DIAGNOSIS — R0789 Other chest pain: Secondary | ICD-10-CM | POA: Diagnosis not present

## 2021-03-08 DIAGNOSIS — I951 Orthostatic hypotension: Secondary | ICD-10-CM | POA: Diagnosis not present

## 2021-03-08 DIAGNOSIS — R7401 Elevation of levels of liver transaminase levels: Secondary | ICD-10-CM | POA: Diagnosis not present

## 2021-03-08 DIAGNOSIS — M1990 Unspecified osteoarthritis, unspecified site: Secondary | ICD-10-CM | POA: Diagnosis not present

## 2021-03-08 DIAGNOSIS — E785 Hyperlipidemia, unspecified: Secondary | ICD-10-CM | POA: Diagnosis not present

## 2021-03-08 DIAGNOSIS — Z743 Need for continuous supervision: Secondary | ICD-10-CM | POA: Diagnosis not present

## 2021-03-08 DIAGNOSIS — J449 Chronic obstructive pulmonary disease, unspecified: Secondary | ICD-10-CM | POA: Diagnosis not present

## 2021-03-08 DIAGNOSIS — Z951 Presence of aortocoronary bypass graft: Secondary | ICD-10-CM | POA: Diagnosis not present

## 2021-03-08 DIAGNOSIS — T3695XA Adverse effect of unspecified systemic antibiotic, initial encounter: Secondary | ICD-10-CM | POA: Diagnosis not present

## 2021-03-08 DIAGNOSIS — D696 Thrombocytopenia, unspecified: Secondary | ICD-10-CM | POA: Diagnosis not present

## 2021-03-08 DIAGNOSIS — Z86711 Personal history of pulmonary embolism: Secondary | ICD-10-CM | POA: Diagnosis not present

## 2021-03-08 DIAGNOSIS — Z9181 History of falling: Secondary | ICD-10-CM | POA: Diagnosis not present

## 2021-03-08 DIAGNOSIS — E1122 Type 2 diabetes mellitus with diabetic chronic kidney disease: Secondary | ICD-10-CM | POA: Diagnosis not present

## 2021-03-08 DIAGNOSIS — I4891 Unspecified atrial fibrillation: Secondary | ICD-10-CM | POA: Diagnosis not present

## 2021-03-08 DIAGNOSIS — R531 Weakness: Secondary | ICD-10-CM | POA: Diagnosis not present

## 2021-03-08 DIAGNOSIS — Z7901 Long term (current) use of anticoagulants: Secondary | ICD-10-CM | POA: Diagnosis not present

## 2021-03-08 DIAGNOSIS — R079 Chest pain, unspecified: Secondary | ICD-10-CM | POA: Diagnosis not present

## 2021-03-09 DIAGNOSIS — I130 Hypertensive heart and chronic kidney disease with heart failure and stage 1 through stage 4 chronic kidney disease, or unspecified chronic kidney disease: Secondary | ICD-10-CM | POA: Diagnosis not present

## 2021-03-09 DIAGNOSIS — I5033 Acute on chronic diastolic (congestive) heart failure: Secondary | ICD-10-CM | POA: Diagnosis not present

## 2021-03-09 DIAGNOSIS — G928 Other toxic encephalopathy: Secondary | ICD-10-CM | POA: Diagnosis not present

## 2021-03-10 DIAGNOSIS — B965 Pseudomonas (aeruginosa) (mallei) (pseudomallei) as the cause of diseases classified elsewhere: Secondary | ICD-10-CM | POA: Diagnosis not present

## 2021-03-10 DIAGNOSIS — J449 Chronic obstructive pulmonary disease, unspecified: Secondary | ICD-10-CM | POA: Diagnosis not present

## 2021-03-10 DIAGNOSIS — A4181 Sepsis due to Enterococcus: Secondary | ICD-10-CM | POA: Diagnosis not present

## 2021-03-10 DIAGNOSIS — I2510 Atherosclerotic heart disease of native coronary artery without angina pectoris: Secondary | ICD-10-CM | POA: Diagnosis not present

## 2021-03-10 DIAGNOSIS — E1122 Type 2 diabetes mellitus with diabetic chronic kidney disease: Secondary | ICD-10-CM | POA: Diagnosis not present

## 2021-03-10 DIAGNOSIS — I130 Hypertensive heart and chronic kidney disease with heart failure and stage 1 through stage 4 chronic kidney disease, or unspecified chronic kidney disease: Secondary | ICD-10-CM | POA: Diagnosis not present

## 2021-03-10 DIAGNOSIS — K8020 Calculus of gallbladder without cholecystitis without obstruction: Secondary | ICD-10-CM | POA: Diagnosis not present

## 2021-03-10 DIAGNOSIS — M1990 Unspecified osteoarthritis, unspecified site: Secondary | ICD-10-CM | POA: Diagnosis not present

## 2021-03-10 DIAGNOSIS — G9341 Metabolic encephalopathy: Secondary | ICD-10-CM | POA: Diagnosis not present

## 2021-03-10 DIAGNOSIS — D696 Thrombocytopenia, unspecified: Secondary | ICD-10-CM | POA: Diagnosis not present

## 2021-03-10 DIAGNOSIS — M109 Gout, unspecified: Secondary | ICD-10-CM | POA: Diagnosis not present

## 2021-03-10 DIAGNOSIS — N184 Chronic kidney disease, stage 4 (severe): Secondary | ICD-10-CM | POA: Diagnosis not present

## 2021-03-10 DIAGNOSIS — E039 Hypothyroidism, unspecified: Secondary | ICD-10-CM | POA: Diagnosis not present

## 2021-03-10 DIAGNOSIS — I5032 Chronic diastolic (congestive) heart failure: Secondary | ICD-10-CM | POA: Diagnosis not present

## 2021-03-10 DIAGNOSIS — Z7901 Long term (current) use of anticoagulants: Secondary | ICD-10-CM | POA: Diagnosis not present

## 2021-03-10 DIAGNOSIS — N390 Urinary tract infection, site not specified: Secondary | ICD-10-CM | POA: Diagnosis not present

## 2021-03-10 DIAGNOSIS — I4891 Unspecified atrial fibrillation: Secondary | ICD-10-CM | POA: Diagnosis not present

## 2021-03-10 DIAGNOSIS — Z792 Long term (current) use of antibiotics: Secondary | ICD-10-CM | POA: Diagnosis not present

## 2021-03-10 DIAGNOSIS — B348 Other viral infections of unspecified site: Secondary | ICD-10-CM | POA: Diagnosis not present

## 2021-03-10 DIAGNOSIS — Z452 Encounter for adjustment and management of vascular access device: Secondary | ICD-10-CM | POA: Diagnosis not present

## 2021-03-10 DIAGNOSIS — E785 Hyperlipidemia, unspecified: Secondary | ICD-10-CM | POA: Diagnosis not present

## 2021-03-11 DIAGNOSIS — R079 Chest pain, unspecified: Secondary | ICD-10-CM | POA: Diagnosis not present

## 2021-03-11 DIAGNOSIS — J811 Chronic pulmonary edema: Secondary | ICD-10-CM | POA: Diagnosis not present

## 2021-03-11 DIAGNOSIS — A419 Sepsis, unspecified organism: Secondary | ICD-10-CM | POA: Diagnosis not present

## 2021-03-11 DIAGNOSIS — Z09 Encounter for follow-up examination after completed treatment for conditions other than malignant neoplasm: Secondary | ICD-10-CM | POA: Diagnosis not present

## 2021-03-11 DIAGNOSIS — G9340 Encephalopathy, unspecified: Secondary | ICD-10-CM | POA: Diagnosis not present

## 2021-03-11 DIAGNOSIS — Z7689 Persons encountering health services in other specified circumstances: Secondary | ICD-10-CM | POA: Diagnosis not present

## 2021-03-11 DIAGNOSIS — N390 Urinary tract infection, site not specified: Secondary | ICD-10-CM | POA: Diagnosis not present

## 2021-03-14 DIAGNOSIS — N184 Chronic kidney disease, stage 4 (severe): Secondary | ICD-10-CM | POA: Diagnosis not present

## 2021-03-14 DIAGNOSIS — G9340 Encephalopathy, unspecified: Secondary | ICD-10-CM | POA: Diagnosis not present

## 2021-03-14 DIAGNOSIS — I130 Hypertensive heart and chronic kidney disease with heart failure and stage 1 through stage 4 chronic kidney disease, or unspecified chronic kidney disease: Secondary | ICD-10-CM | POA: Diagnosis not present

## 2021-03-14 DIAGNOSIS — I5032 Chronic diastolic (congestive) heart failure: Secondary | ICD-10-CM | POA: Diagnosis not present

## 2021-03-14 DIAGNOSIS — I2510 Atherosclerotic heart disease of native coronary artery without angina pectoris: Secondary | ICD-10-CM | POA: Diagnosis not present

## 2021-03-14 DIAGNOSIS — E785 Hyperlipidemia, unspecified: Secondary | ICD-10-CM | POA: Diagnosis not present

## 2021-03-14 DIAGNOSIS — Z792 Long term (current) use of antibiotics: Secondary | ICD-10-CM | POA: Diagnosis not present

## 2021-03-14 DIAGNOSIS — K8020 Calculus of gallbladder without cholecystitis without obstruction: Secondary | ICD-10-CM | POA: Diagnosis not present

## 2021-03-14 DIAGNOSIS — D696 Thrombocytopenia, unspecified: Secondary | ICD-10-CM | POA: Diagnosis not present

## 2021-03-14 DIAGNOSIS — J449 Chronic obstructive pulmonary disease, unspecified: Secondary | ICD-10-CM | POA: Diagnosis not present

## 2021-03-14 DIAGNOSIS — N390 Urinary tract infection, site not specified: Secondary | ICD-10-CM | POA: Diagnosis not present

## 2021-03-14 DIAGNOSIS — E039 Hypothyroidism, unspecified: Secondary | ICD-10-CM | POA: Diagnosis not present

## 2021-03-14 DIAGNOSIS — I4891 Unspecified atrial fibrillation: Secondary | ICD-10-CM | POA: Diagnosis not present

## 2021-03-14 DIAGNOSIS — M109 Gout, unspecified: Secondary | ICD-10-CM | POA: Diagnosis not present

## 2021-03-14 DIAGNOSIS — A4181 Sepsis due to Enterococcus: Secondary | ICD-10-CM | POA: Diagnosis not present

## 2021-03-14 DIAGNOSIS — M1990 Unspecified osteoarthritis, unspecified site: Secondary | ICD-10-CM | POA: Diagnosis not present

## 2021-03-14 DIAGNOSIS — G9341 Metabolic encephalopathy: Secondary | ICD-10-CM | POA: Diagnosis not present

## 2021-03-14 DIAGNOSIS — B348 Other viral infections of unspecified site: Secondary | ICD-10-CM | POA: Diagnosis not present

## 2021-03-14 DIAGNOSIS — Z452 Encounter for adjustment and management of vascular access device: Secondary | ICD-10-CM | POA: Diagnosis not present

## 2021-03-14 DIAGNOSIS — E1122 Type 2 diabetes mellitus with diabetic chronic kidney disease: Secondary | ICD-10-CM | POA: Diagnosis not present

## 2021-03-14 DIAGNOSIS — B965 Pseudomonas (aeruginosa) (mallei) (pseudomallei) as the cause of diseases classified elsewhere: Secondary | ICD-10-CM | POA: Diagnosis not present

## 2021-03-14 DIAGNOSIS — Z7901 Long term (current) use of anticoagulants: Secondary | ICD-10-CM | POA: Diagnosis not present

## 2021-03-14 DIAGNOSIS — A419 Sepsis, unspecified organism: Secondary | ICD-10-CM | POA: Diagnosis not present

## 2021-03-22 DIAGNOSIS — I739 Peripheral vascular disease, unspecified: Secondary | ICD-10-CM | POA: Diagnosis not present

## 2021-03-22 DIAGNOSIS — L03119 Cellulitis of unspecified part of limb: Secondary | ICD-10-CM | POA: Diagnosis not present

## 2021-03-28 DIAGNOSIS — L03119 Cellulitis of unspecified part of limb: Secondary | ICD-10-CM | POA: Diagnosis not present

## 2021-03-28 DIAGNOSIS — I739 Peripheral vascular disease, unspecified: Secondary | ICD-10-CM | POA: Diagnosis not present

## 2021-04-06 DIAGNOSIS — L03119 Cellulitis of unspecified part of limb: Secondary | ICD-10-CM | POA: Diagnosis not present

## 2021-04-08 DIAGNOSIS — L03119 Cellulitis of unspecified part of limb: Secondary | ICD-10-CM | POA: Diagnosis not present

## 2021-04-08 DIAGNOSIS — N184 Chronic kidney disease, stage 4 (severe): Secondary | ICD-10-CM | POA: Diagnosis not present

## 2021-04-21 DIAGNOSIS — L03119 Cellulitis of unspecified part of limb: Secondary | ICD-10-CM | POA: Diagnosis not present

## 2021-04-21 DIAGNOSIS — N184 Chronic kidney disease, stage 4 (severe): Secondary | ICD-10-CM | POA: Diagnosis not present

## 2021-04-25 DIAGNOSIS — L03119 Cellulitis of unspecified part of limb: Secondary | ICD-10-CM | POA: Diagnosis not present

## 2021-04-25 DIAGNOSIS — N184 Chronic kidney disease, stage 4 (severe): Secondary | ICD-10-CM | POA: Diagnosis not present

## 2021-06-13 DIAGNOSIS — I509 Heart failure, unspecified: Secondary | ICD-10-CM | POA: Diagnosis not present

## 2021-06-13 DIAGNOSIS — E039 Hypothyroidism, unspecified: Secondary | ICD-10-CM | POA: Diagnosis not present

## 2021-06-13 DIAGNOSIS — N183 Chronic kidney disease, stage 3 unspecified: Secondary | ICD-10-CM | POA: Diagnosis not present

## 2021-06-13 DIAGNOSIS — E785 Hyperlipidemia, unspecified: Secondary | ICD-10-CM | POA: Diagnosis not present

## 2021-06-13 DIAGNOSIS — I130 Hypertensive heart and chronic kidney disease with heart failure and stage 1 through stage 4 chronic kidney disease, or unspecified chronic kidney disease: Secondary | ICD-10-CM | POA: Diagnosis not present

## 2021-06-13 DIAGNOSIS — I4891 Unspecified atrial fibrillation: Secondary | ICD-10-CM | POA: Diagnosis not present

## 2021-06-13 DIAGNOSIS — N184 Chronic kidney disease, stage 4 (severe): Secondary | ICD-10-CM | POA: Diagnosis not present

## 2021-06-13 DIAGNOSIS — D649 Anemia, unspecified: Secondary | ICD-10-CM | POA: Diagnosis not present

## 2021-06-13 DIAGNOSIS — E1169 Type 2 diabetes mellitus with other specified complication: Secondary | ICD-10-CM | POA: Diagnosis not present

## 2021-06-13 DIAGNOSIS — E1122 Type 2 diabetes mellitus with diabetic chronic kidney disease: Secondary | ICD-10-CM | POA: Diagnosis not present

## 2021-06-13 DIAGNOSIS — S41119A Laceration without foreign body of unspecified upper arm, initial encounter: Secondary | ICD-10-CM | POA: Diagnosis not present

## 2021-06-13 DIAGNOSIS — M109 Gout, unspecified: Secondary | ICD-10-CM | POA: Diagnosis not present

## 2021-06-15 DIAGNOSIS — Z743 Need for continuous supervision: Secondary | ICD-10-CM | POA: Diagnosis not present

## 2021-06-15 DIAGNOSIS — R531 Weakness: Secondary | ICD-10-CM | POA: Diagnosis not present

## 2021-06-15 DIAGNOSIS — R5383 Other fatigue: Secondary | ICD-10-CM | POA: Diagnosis not present

## 2021-06-15 DIAGNOSIS — N3000 Acute cystitis without hematuria: Secondary | ICD-10-CM | POA: Diagnosis not present

## 2021-06-15 DIAGNOSIS — R0902 Hypoxemia: Secondary | ICD-10-CM | POA: Diagnosis not present

## 2021-06-15 DIAGNOSIS — G928 Other toxic encephalopathy: Secondary | ICD-10-CM | POA: Diagnosis not present

## 2021-06-15 DIAGNOSIS — Z20822 Contact with and (suspected) exposure to covid-19: Secondary | ICD-10-CM | POA: Diagnosis not present

## 2021-06-24 DIAGNOSIS — H43811 Vitreous degeneration, right eye: Secondary | ICD-10-CM | POA: Diagnosis not present

## 2021-08-02 DIAGNOSIS — Z0000 Encounter for general adult medical examination without abnormal findings: Secondary | ICD-10-CM | POA: Diagnosis not present

## 2021-08-02 DIAGNOSIS — Z139 Encounter for screening, unspecified: Secondary | ICD-10-CM | POA: Diagnosis not present

## 2021-08-02 DIAGNOSIS — E785 Hyperlipidemia, unspecified: Secondary | ICD-10-CM | POA: Diagnosis not present

## 2021-08-02 DIAGNOSIS — Z9181 History of falling: Secondary | ICD-10-CM | POA: Diagnosis not present

## 2021-08-25 DIAGNOSIS — R410 Disorientation, unspecified: Secondary | ICD-10-CM | POA: Diagnosis not present

## 2021-12-31 DIAGNOSIS — N390 Urinary tract infection, site not specified: Secondary | ICD-10-CM | POA: Diagnosis not present

## 2022-02-16 DEATH — deceased
